# Patient Record
Sex: Male | Born: 1965
Health system: Southern US, Community
[De-identification: ages and names within clinical notes are randomized; demographics above are authoritative.]

## PROBLEM LIST (undated history)

## (undated) DIAGNOSIS — F32A Depression, unspecified: Secondary | ICD-10-CM

## (undated) DIAGNOSIS — Z8719 Personal history of other diseases of the digestive system: Secondary | ICD-10-CM

## (undated) DIAGNOSIS — T7840XA Allergy, unspecified, initial encounter: Secondary | ICD-10-CM

## (undated) DIAGNOSIS — I10 Essential (primary) hypertension: Secondary | ICD-10-CM

## (undated) DIAGNOSIS — F329 Major depressive disorder, single episode, unspecified: Secondary | ICD-10-CM

## (undated) DIAGNOSIS — F419 Anxiety disorder, unspecified: Secondary | ICD-10-CM

## (undated) HISTORY — DX: Allergy, unspecified, initial encounter: T78.40XA

## (undated) HISTORY — DX: Depression, unspecified: F32.A

## (undated) HISTORY — DX: Major depressive disorder, single episode, unspecified: F32.9

## (undated) HISTORY — DX: Personal history of other diseases of the digestive system: Z87.19

## (undated) HISTORY — PX: WISDOM TOOTH EXTRACTION: SHX21

## (undated) HISTORY — PX: VASECTOMY: SHX75

## (undated) HISTORY — DX: Essential (primary) hypertension: I10

## (undated) HISTORY — DX: Anxiety disorder, unspecified: F41.9

## (undated) HISTORY — PX: MOUTH SURGERY: SHX715

---

## 2002-06-03 ENCOUNTER — Encounter: Admission: RE | Admit: 2002-06-03 | Discharge: 2002-06-03 | Payer: Self-pay | Admitting: Internal Medicine

## 2009-06-29 ENCOUNTER — Encounter: Payer: Self-pay | Admitting: Infectious Diseases

## 2009-06-29 ENCOUNTER — Ambulatory Visit: Payer: Self-pay | Admitting: Infectious Diseases

## 2009-06-30 ENCOUNTER — Encounter: Payer: Self-pay | Admitting: Infectious Diseases

## 2009-06-30 ENCOUNTER — Ambulatory Visit: Payer: Self-pay | Admitting: Infectious Diseases

## 2009-10-29 ENCOUNTER — Emergency Department (HOSPITAL_COMMUNITY): Admission: EM | Admit: 2009-10-29 | Discharge: 2009-10-29 | Payer: Self-pay | Admitting: Family Medicine

## 2012-01-09 ENCOUNTER — Ambulatory Visit (INDEPENDENT_AMBULATORY_CARE_PROVIDER_SITE_OTHER): Payer: 59 | Admitting: Sports Medicine

## 2012-01-09 VITALS — BP 100/60 | Ht 69.5 in | Wt 148.0 lb

## 2012-01-09 DIAGNOSIS — M25562 Pain in left knee: Secondary | ICD-10-CM | POA: Insufficient documentation

## 2012-01-09 DIAGNOSIS — M25569 Pain in unspecified knee: Secondary | ICD-10-CM

## 2012-01-09 MED ORDER — MELOXICAM 15 MG PO TABS: 15.0000 mg | ORAL_TABLET | Freq: Every day | ORAL | Status: AC

## 2012-01-09 MED ORDER — KETOPROFEN POWD: Status: DC

## 2012-01-09 NOTE — Patient Instructions (Signed)
Try compression sleeve for activity   Continue biking 2-3 days per week  Try ketoprofen gel over the sore area on your knee  Follow up as needed  Thank you for seeing [place] today!

## 2012-01-09 NOTE — Assessment & Plan Note (Signed)
We will try some topical ketoprofen gel for the lateral kneecap  If this is not helping he continues Mobic 15 mg daily when this flares  I would like him to try compression sleeve to prevent swelling and perhaps change the tracking to where he doesn't have much irritation  If this causes him much pain we could try direct injection into the area of scar tissue

## 2012-01-09 NOTE — Progress Notes (Signed)
Pt is here today with some L knee pain that has been ongoing x 2 wks now. No swelling present but the pain seems to be more suprapatella and going down into the patella tendon area. Pt cycles and runs; cycles more in the warmer months and runs more during the winter/fall months. Pain usually starts around mile 5 during running. No pain when sitting for long periods.   Hx of knee pain- sharp pain behind knee cap.  Worse in late 90s when he was doing marathons. He has most pain with long runs or when carrying a backpack and walking down hills. He does not get pain sitting in a theater.  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    NAD  Lt knee exam: Crepitation with moving patella  Full knee flexion and extension Gets clicking at upper outer corner with flexion and extension Ligaments stable Mcmurray's neg Excellent hip abduction strength on lt Hip flexion and sartorius strength excellent on lt Excellent quad strength  Neutral arch Excellent great toe motion on lt 5th MTP hypertrophy on lt  MSK ultrasound The patellar and quad tendons are normal Vastus lateralis tendon just as it attaches to the patella there is a triangular area of scar tissue that appears calcified There is some hypoechoic change below this and a small suprapatellar pouch effusion Superior patellar groove was visualized and it really looks intact without significant arthritic change

## 2013-09-09 ENCOUNTER — Other Ambulatory Visit: Payer: Self-pay | Admitting: Internal Medicine

## 2013-09-09 DIAGNOSIS — R0989 Other specified symptoms and signs involving the circulatory and respiratory systems: Secondary | ICD-10-CM

## 2013-09-13 ENCOUNTER — Ambulatory Visit
Admission: RE | Admit: 2013-09-13 | Discharge: 2013-09-13 | Disposition: A | Discharge status: Home / Self Care | Payer: 59 | Source: Ambulatory Visit | Attending: Internal Medicine | Admitting: Internal Medicine

## 2013-09-13 DIAGNOSIS — R0989 Other specified symptoms and signs involving the circulatory and respiratory systems: Secondary | ICD-10-CM

## 2015-05-17 ENCOUNTER — Other Ambulatory Visit: Payer: Self-pay | Admitting: *Deleted

## 2015-05-17 MED ORDER — MELOXICAM 15 MG PO TABS: 15.0000 mg | ORAL_TABLET | Freq: Every day | ORAL | Status: DC

## 2015-07-18 ENCOUNTER — Ambulatory Visit (INDEPENDENT_AMBULATORY_CARE_PROVIDER_SITE_OTHER): Payer: 59 | Admitting: Sports Medicine

## 2015-07-18 VITALS — BP 106/67 | Ht 69.0 in | Wt 143.0 lb

## 2015-07-18 DIAGNOSIS — S86812A Strain of other muscle(s) and tendon(s) at lower leg level, left leg, initial encounter: Secondary | ICD-10-CM

## 2015-07-18 DIAGNOSIS — S86119A Strain of other muscle(s) and tendon(s) of posterior muscle group at lower leg level, unspecified leg, initial encounter: Secondary | ICD-10-CM | POA: Insufficient documentation

## 2015-07-18 DIAGNOSIS — S86112A Strain of other muscle(s) and tendon(s) of posterior muscle group at lower leg level, left leg, initial encounter: Secondary | ICD-10-CM

## 2015-07-18 NOTE — Assessment & Plan Note (Signed)
HEP with eccentric calf - note he needs to do this long term to prevent recurrence  Compression  Heel lifts and sports insoles  Icing  OK to do norm activities that do not worsen sxs  NTG protocol  Reck if not better in 6 wks

## 2015-07-18 NOTE — Progress Notes (Signed)
Patient ID: Randy Meyer, male   DOB: 07-31-1966, 49 y.o.   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

## 2015-08-26 DIAGNOSIS — J309 Allergic rhinitis, unspecified: Secondary | ICD-10-CM

## 2015-08-26 DIAGNOSIS — L501 Idiopathic urticaria: Secondary | ICD-10-CM | POA: Insufficient documentation

## 2015-08-26 DIAGNOSIS — H1010 Acute atopic conjunctivitis, unspecified eye: Secondary | ICD-10-CM | POA: Insufficient documentation

## 2015-09-01 ENCOUNTER — Other Ambulatory Visit: Payer: Self-pay | Admitting: *Deleted

## 2015-09-01 MED ORDER — OMALIZUMAB 150 MG ~~LOC~~ SOLR: 300.0000 mg | SUBCUTANEOUS | Status: DC

## 2015-10-06 ENCOUNTER — Ambulatory Visit (INDEPENDENT_AMBULATORY_CARE_PROVIDER_SITE_OTHER): Payer: 59 | Admitting: Neurology

## 2015-10-06 DIAGNOSIS — J309 Allergic rhinitis, unspecified: Secondary | ICD-10-CM

## 2015-10-11 ENCOUNTER — Ambulatory Visit (INDEPENDENT_AMBULATORY_CARE_PROVIDER_SITE_OTHER): Payer: 59 | Admitting: Neurology

## 2015-10-11 DIAGNOSIS — J309 Allergic rhinitis, unspecified: Secondary | ICD-10-CM | POA: Diagnosis not present

## 2015-10-18 ENCOUNTER — Ambulatory Visit (INDEPENDENT_AMBULATORY_CARE_PROVIDER_SITE_OTHER): Payer: 59 | Admitting: Neurology

## 2015-10-18 DIAGNOSIS — J309 Allergic rhinitis, unspecified: Secondary | ICD-10-CM

## 2015-10-23 ENCOUNTER — Other Ambulatory Visit: Payer: Self-pay | Admitting: Internal Medicine

## 2015-10-23 ENCOUNTER — Ambulatory Visit: Payer: 59

## 2015-10-23 DIAGNOSIS — M545 Low back pain: Secondary | ICD-10-CM

## 2015-10-24 ENCOUNTER — Ambulatory Visit
Admission: RE | Admit: 2015-10-24 | Discharge: 2015-10-24 | Disposition: A | Discharge status: Home / Self Care | Payer: 59 | Source: Ambulatory Visit | Attending: Internal Medicine | Admitting: Internal Medicine

## 2015-10-24 DIAGNOSIS — M545 Low back pain: Secondary | ICD-10-CM

## 2015-10-26 ENCOUNTER — Ambulatory Visit (INDEPENDENT_AMBULATORY_CARE_PROVIDER_SITE_OTHER): Payer: 59

## 2015-10-26 DIAGNOSIS — J309 Allergic rhinitis, unspecified: Secondary | ICD-10-CM

## 2015-11-02 ENCOUNTER — Ambulatory Visit (INDEPENDENT_AMBULATORY_CARE_PROVIDER_SITE_OTHER): Payer: 59

## 2015-11-02 DIAGNOSIS — J309 Allergic rhinitis, unspecified: Secondary | ICD-10-CM | POA: Diagnosis not present

## 2015-11-02 DIAGNOSIS — J3081 Allergic rhinitis due to animal (cat) (dog) hair and dander: Secondary | ICD-10-CM | POA: Diagnosis not present

## 2015-11-03 DIAGNOSIS — J3089 Other allergic rhinitis: Secondary | ICD-10-CM | POA: Diagnosis not present

## 2015-11-10 ENCOUNTER — Ambulatory Visit (INDEPENDENT_AMBULATORY_CARE_PROVIDER_SITE_OTHER): Payer: 59

## 2015-11-10 DIAGNOSIS — J309 Allergic rhinitis, unspecified: Secondary | ICD-10-CM

## 2015-11-24 ENCOUNTER — Ambulatory Visit (INDEPENDENT_AMBULATORY_CARE_PROVIDER_SITE_OTHER): Payer: 59 | Admitting: Neurology

## 2015-11-24 DIAGNOSIS — J309 Allergic rhinitis, unspecified: Secondary | ICD-10-CM | POA: Diagnosis not present

## 2015-12-07 ENCOUNTER — Ambulatory Visit (INDEPENDENT_AMBULATORY_CARE_PROVIDER_SITE_OTHER): Payer: 59

## 2015-12-07 DIAGNOSIS — J309 Allergic rhinitis, unspecified: Secondary | ICD-10-CM | POA: Diagnosis not present

## 2015-12-14 ENCOUNTER — Ambulatory Visit (INDEPENDENT_AMBULATORY_CARE_PROVIDER_SITE_OTHER): Payer: 59

## 2015-12-14 DIAGNOSIS — J309 Allergic rhinitis, unspecified: Secondary | ICD-10-CM

## 2015-12-24 HISTORY — PX: COLONOSCOPY: SHX174

## 2015-12-28 DIAGNOSIS — Z0000 Encounter for general adult medical examination without abnormal findings: Secondary | ICD-10-CM | POA: Diagnosis not present

## 2016-01-02 DIAGNOSIS — F419 Anxiety disorder, unspecified: Secondary | ICD-10-CM | POA: Diagnosis not present

## 2016-01-05 ENCOUNTER — Ambulatory Visit (INDEPENDENT_AMBULATORY_CARE_PROVIDER_SITE_OTHER): Payer: 59 | Admitting: *Deleted

## 2016-01-05 DIAGNOSIS — J309 Allergic rhinitis, unspecified: Secondary | ICD-10-CM

## 2016-01-08 MED FILL — valACYclovir HCL 1 GM TABS: 1 | 14 days supply | Qty: 28 | Fill #0

## 2016-01-10 ENCOUNTER — Ambulatory Visit (INDEPENDENT_AMBULATORY_CARE_PROVIDER_SITE_OTHER): Payer: 59 | Admitting: Neurology

## 2016-01-10 DIAGNOSIS — J309 Allergic rhinitis, unspecified: Secondary | ICD-10-CM | POA: Diagnosis not present

## 2016-01-19 ENCOUNTER — Ambulatory Visit (INDEPENDENT_AMBULATORY_CARE_PROVIDER_SITE_OTHER): Payer: 59 | Admitting: Neurology

## 2016-01-19 DIAGNOSIS — J309 Allergic rhinitis, unspecified: Secondary | ICD-10-CM

## 2016-01-30 ENCOUNTER — Ambulatory Visit (INDEPENDENT_AMBULATORY_CARE_PROVIDER_SITE_OTHER): Payer: 59 | Admitting: *Deleted

## 2016-01-30 DIAGNOSIS — J309 Allergic rhinitis, unspecified: Secondary | ICD-10-CM

## 2016-02-06 ENCOUNTER — Ambulatory Visit (INDEPENDENT_AMBULATORY_CARE_PROVIDER_SITE_OTHER): Payer: 59

## 2016-02-06 DIAGNOSIS — J309 Allergic rhinitis, unspecified: Secondary | ICD-10-CM

## 2016-02-12 MED FILL — SERTRALINE HCL 25 MG TABLET: 25 | 90 days supply | Qty: 90 | Fill #0

## 2016-02-15 ENCOUNTER — Ambulatory Visit (INDEPENDENT_AMBULATORY_CARE_PROVIDER_SITE_OTHER): Payer: 59

## 2016-02-15 DIAGNOSIS — J309 Allergic rhinitis, unspecified: Secondary | ICD-10-CM

## 2016-02-20 DIAGNOSIS — F419 Anxiety disorder, unspecified: Secondary | ICD-10-CM | POA: Diagnosis not present

## 2016-02-22 MED FILL — LEVOCETIRIZINE 5 MG TABLET: 5 | 90 days supply | Qty: 180 | Fill #1

## 2016-03-01 ENCOUNTER — Ambulatory Visit (INDEPENDENT_AMBULATORY_CARE_PROVIDER_SITE_OTHER): Payer: 59 | Admitting: *Deleted

## 2016-03-01 DIAGNOSIS — J309 Allergic rhinitis, unspecified: Secondary | ICD-10-CM

## 2016-03-12 ENCOUNTER — Ambulatory Visit (INDEPENDENT_AMBULATORY_CARE_PROVIDER_SITE_OTHER): Payer: 59

## 2016-03-12 DIAGNOSIS — J309 Allergic rhinitis, unspecified: Secondary | ICD-10-CM | POA: Diagnosis not present

## 2016-03-26 DIAGNOSIS — F419 Anxiety disorder, unspecified: Secondary | ICD-10-CM | POA: Diagnosis not present

## 2016-03-27 ENCOUNTER — Ambulatory Visit (INDEPENDENT_AMBULATORY_CARE_PROVIDER_SITE_OTHER): Payer: 59

## 2016-03-27 DIAGNOSIS — J309 Allergic rhinitis, unspecified: Secondary | ICD-10-CM | POA: Diagnosis not present

## 2016-04-02 DIAGNOSIS — J3081 Allergic rhinitis due to animal (cat) (dog) hair and dander: Secondary | ICD-10-CM | POA: Diagnosis not present

## 2016-04-03 DIAGNOSIS — J3089 Other allergic rhinitis: Secondary | ICD-10-CM | POA: Diagnosis not present

## 2016-04-09 ENCOUNTER — Ambulatory Visit (INDEPENDENT_AMBULATORY_CARE_PROVIDER_SITE_OTHER): Payer: 59

## 2016-04-09 DIAGNOSIS — J309 Allergic rhinitis, unspecified: Secondary | ICD-10-CM

## 2016-04-23 ENCOUNTER — Ambulatory Visit (INDEPENDENT_AMBULATORY_CARE_PROVIDER_SITE_OTHER): Payer: 59

## 2016-04-23 DIAGNOSIS — J309 Allergic rhinitis, unspecified: Secondary | ICD-10-CM

## 2016-05-02 MED FILL — SERTRALINE HCL 25 MG TABLET: 25 | 90 days supply | Qty: 90 | Fill #1

## 2016-05-07 ENCOUNTER — Ambulatory Visit (INDEPENDENT_AMBULATORY_CARE_PROVIDER_SITE_OTHER): Payer: 59

## 2016-05-07 DIAGNOSIS — J309 Allergic rhinitis, unspecified: Secondary | ICD-10-CM

## 2016-05-14 DIAGNOSIS — F419 Anxiety disorder, unspecified: Secondary | ICD-10-CM | POA: Diagnosis not present

## 2016-05-21 ENCOUNTER — Ambulatory Visit (INDEPENDENT_AMBULATORY_CARE_PROVIDER_SITE_OTHER): Payer: 59 | Admitting: *Deleted

## 2016-05-21 DIAGNOSIS — J309 Allergic rhinitis, unspecified: Secondary | ICD-10-CM | POA: Diagnosis not present

## 2016-05-31 ENCOUNTER — Ambulatory Visit (INDEPENDENT_AMBULATORY_CARE_PROVIDER_SITE_OTHER): Payer: 59 | Admitting: *Deleted

## 2016-05-31 DIAGNOSIS — J309 Allergic rhinitis, unspecified: Secondary | ICD-10-CM | POA: Diagnosis not present

## 2016-06-06 ENCOUNTER — Ambulatory Visit (INDEPENDENT_AMBULATORY_CARE_PROVIDER_SITE_OTHER): Payer: 59

## 2016-06-06 DIAGNOSIS — J309 Allergic rhinitis, unspecified: Secondary | ICD-10-CM | POA: Diagnosis not present

## 2016-07-02 ENCOUNTER — Ambulatory Visit (INDEPENDENT_AMBULATORY_CARE_PROVIDER_SITE_OTHER): Payer: 59 | Admitting: *Deleted

## 2016-07-02 DIAGNOSIS — J309 Allergic rhinitis, unspecified: Secondary | ICD-10-CM | POA: Diagnosis not present

## 2016-07-09 ENCOUNTER — Ambulatory Visit (INDEPENDENT_AMBULATORY_CARE_PROVIDER_SITE_OTHER): Payer: 59 | Admitting: *Deleted

## 2016-07-09 DIAGNOSIS — J309 Allergic rhinitis, unspecified: Secondary | ICD-10-CM | POA: Diagnosis not present

## 2016-07-16 ENCOUNTER — Ambulatory Visit (INDEPENDENT_AMBULATORY_CARE_PROVIDER_SITE_OTHER): Payer: 59 | Admitting: *Deleted

## 2016-07-16 DIAGNOSIS — J309 Allergic rhinitis, unspecified: Secondary | ICD-10-CM

## 2016-07-16 DIAGNOSIS — F419 Anxiety disorder, unspecified: Secondary | ICD-10-CM | POA: Diagnosis not present

## 2016-07-16 MED FILL — LEVOCETIRIZINE 5 MG TABLET: 5 | 90 days supply | Qty: 180 | Fill #2

## 2016-07-30 ENCOUNTER — Ambulatory Visit (INDEPENDENT_AMBULATORY_CARE_PROVIDER_SITE_OTHER): Payer: 59

## 2016-07-30 DIAGNOSIS — J309 Allergic rhinitis, unspecified: Secondary | ICD-10-CM

## 2016-08-06 DIAGNOSIS — J3081 Allergic rhinitis due to animal (cat) (dog) hair and dander: Secondary | ICD-10-CM | POA: Diagnosis not present

## 2016-08-07 DIAGNOSIS — J3089 Other allergic rhinitis: Secondary | ICD-10-CM | POA: Diagnosis not present

## 2016-08-12 MED FILL — SERTRALINE HCL 25 MG TABLET: 25 | 90 days supply | Qty: 90 | Fill #2

## 2016-08-13 ENCOUNTER — Ambulatory Visit (INDEPENDENT_AMBULATORY_CARE_PROVIDER_SITE_OTHER): Payer: 59

## 2016-08-13 DIAGNOSIS — J309 Allergic rhinitis, unspecified: Secondary | ICD-10-CM | POA: Diagnosis not present

## 2016-08-19 ENCOUNTER — Ambulatory Visit (INDEPENDENT_AMBULATORY_CARE_PROVIDER_SITE_OTHER): Payer: 59

## 2016-08-19 DIAGNOSIS — J309 Allergic rhinitis, unspecified: Secondary | ICD-10-CM

## 2016-08-30 ENCOUNTER — Ambulatory Visit (INDEPENDENT_AMBULATORY_CARE_PROVIDER_SITE_OTHER): Payer: 59 | Admitting: *Deleted

## 2016-08-30 DIAGNOSIS — Z9189 Other specified personal risk factors, not elsewhere classified: Secondary | ICD-10-CM | POA: Diagnosis not present

## 2016-08-30 DIAGNOSIS — IMO0002 Reserved for concepts with insufficient information to code with codable children: Secondary | ICD-10-CM

## 2016-08-30 DIAGNOSIS — Z0000 Encounter for general adult medical examination without abnormal findings: Secondary | ICD-10-CM

## 2016-08-30 DIAGNOSIS — Z23 Encounter for immunization: Secondary | ICD-10-CM | POA: Diagnosis not present

## 2016-08-30 DIAGNOSIS — Z789 Other specified health status: Secondary | ICD-10-CM | POA: Diagnosis not present

## 2016-08-30 DIAGNOSIS — Z7189 Other specified counseling: Secondary | ICD-10-CM | POA: Diagnosis not present

## 2016-08-30 DIAGNOSIS — A09 Infectious gastroenteritis and colitis, unspecified: Secondary | ICD-10-CM

## 2016-08-30 MED ORDER — TYPHOID VACCINE PO CPDR: 1.0000 | DELAYED_RELEASE_CAPSULE | ORAL | 0 refills | Status: DC

## 2016-08-30 MED ORDER — ATOVAQUONE-PROGUANIL HCL 250-100 MG PO TABS: 1.0000 | ORAL_TABLET | Freq: Every day | ORAL | 0 refills | Status: DC

## 2016-08-30 MED ORDER — AZITHROMYCIN 500 MG PO TABS: ORAL_TABLET | ORAL | 0 refills | Status: DC

## 2016-08-30 MED FILL — AZITHROMYCIN 500 MG TABLET: 500 | 5 days supply | Qty: 5 | Fill #0

## 2016-08-30 MED FILL — ATOVAQUONE-PROGUANIL 250-10: 250-100 | 7 days supply | Qty: 20 | Fill #0

## 2016-08-30 MED FILL — VIVOTIF EC CAPSULE: 8 days supply | Qty: 4 | Fill #0

## 2016-09-03 DIAGNOSIS — F419 Anxiety disorder, unspecified: Secondary | ICD-10-CM | POA: Diagnosis not present

## 2016-09-05 ENCOUNTER — Ambulatory Visit (INDEPENDENT_AMBULATORY_CARE_PROVIDER_SITE_OTHER): Payer: 59 | Admitting: *Deleted

## 2016-09-05 DIAGNOSIS — J309 Allergic rhinitis, unspecified: Secondary | ICD-10-CM

## 2016-09-17 DIAGNOSIS — F419 Anxiety disorder, unspecified: Secondary | ICD-10-CM | POA: Diagnosis not present

## 2016-09-24 ENCOUNTER — Ambulatory Visit (INDEPENDENT_AMBULATORY_CARE_PROVIDER_SITE_OTHER): Payer: 59

## 2016-09-24 DIAGNOSIS — H524 Presbyopia: Secondary | ICD-10-CM | POA: Diagnosis not present

## 2016-09-24 DIAGNOSIS — J309 Allergic rhinitis, unspecified: Secondary | ICD-10-CM | POA: Diagnosis not present

## 2016-10-01 ENCOUNTER — Ambulatory Visit (INDEPENDENT_AMBULATORY_CARE_PROVIDER_SITE_OTHER): Payer: 59 | Admitting: *Deleted

## 2016-10-01 DIAGNOSIS — J309 Allergic rhinitis, unspecified: Secondary | ICD-10-CM

## 2016-10-04 ENCOUNTER — Ambulatory Visit: Payer: 59 | Admitting: *Deleted

## 2016-10-04 VITALS — Ht 69.0 in | Wt 153.2 lb

## 2016-10-04 DIAGNOSIS — Z1211 Encounter for screening for malignant neoplasm of colon: Secondary | ICD-10-CM

## 2016-10-04 NOTE — Progress Notes (Signed)
Patient denies any allergies to egg or soy products. Patient denies complications with anesthesia/sedation.  Patient denies oxygen use at home and denies diet medications. Emmi instructions for colonoscopy explained but patient denied.     

## 2016-10-10 ENCOUNTER — Ambulatory Visit (INDEPENDENT_AMBULATORY_CARE_PROVIDER_SITE_OTHER): Payer: 59 | Admitting: *Deleted

## 2016-10-10 DIAGNOSIS — J309 Allergic rhinitis, unspecified: Secondary | ICD-10-CM | POA: Diagnosis not present

## 2016-10-10 DIAGNOSIS — F419 Anxiety disorder, unspecified: Secondary | ICD-10-CM | POA: Diagnosis not present

## 2016-10-18 ENCOUNTER — Ambulatory Visit (INDEPENDENT_AMBULATORY_CARE_PROVIDER_SITE_OTHER): Payer: 59

## 2016-10-18 DIAGNOSIS — J309 Allergic rhinitis, unspecified: Secondary | ICD-10-CM

## 2016-10-22 ENCOUNTER — Encounter: Payer: Self-pay | Admitting: Internal Medicine

## 2016-10-22 ENCOUNTER — Ambulatory Visit (AMBULATORY_SURGERY_CENTER): Payer: 59 | Admitting: Internal Medicine

## 2016-10-22 VITALS — BP 112/58 | HR 57 | Temp 97.1°F | Resp 10 | Ht 69.0 in | Wt 153.0 lb

## 2016-10-22 DIAGNOSIS — Z1212 Encounter for screening for malignant neoplasm of rectum: Secondary | ICD-10-CM | POA: Diagnosis not present

## 2016-10-22 DIAGNOSIS — Z1211 Encounter for screening for malignant neoplasm of colon: Secondary | ICD-10-CM

## 2016-10-22 MED ORDER — SODIUM CHLORIDE 0.9 % IV SOLN: 500.0000 mL | INTRAVENOUS | Status: DC

## 2016-10-22 NOTE — Patient Instructions (Addendum)
   The colonoscopy looked normal. I did not see any fissure(s).  Next routine colonoscopy/screening test in 10 years - 2027  I appreciate the opportunity to care for you. Gatha Mayer, MD, FACG   YOU HAD AN ENDOSCOPIC PROCEDURE TODAY AT [facility]:   Refer to the procedure report that was given to you for any specific questions about what was found during the examination.  If the procedure report does not answer your questions, please call your gastroenterologist to clarify.  If you requested that your care partner not be given the details of your procedure findings, then the procedure report has been included in a sealed envelope for you to review at your convenience later.  YOU SHOULD EXPECT: Some feelings of bloating in the abdomen. Passage of more gas than usual.  Walking can help get rid of the air that was put into your GI tract during the procedure and reduce the bloating. If you had a lower endoscopy (such as a colonoscopy or flexible sigmoidoscopy) you may notice spotting of blood in your stool or on the toilet paper. If you underwent a bowel prep for your procedure, you may not have a normal bowel movement for a few days.  Please Note:  You might notice some irritation and congestion in your nose or some drainage.  This is from the oxygen used during your procedure.  There is no need for concern and it should clear up in a day or so.  SYMPTOMS TO REPORT IMMEDIATELY:   Following lower endoscopy (colonoscopy or flexible sigmoidoscopy):  Excessive amounts of blood in the stool  Significant tenderness or worsening of abdominal pains  Swelling of the abdomen that is new, acute  Fever of 100F or higher  For urgent or emergent issues, a gastroenterologist can be reached at any hour by calling [phone].  DIET:  We do recommend a small meal at first, but then you may proceed to your regular diet.  Drink plenty of fluids but you should avoid alcoholic  beverages for 24 hours.  ACTIVITY:  You should plan to take it easy for the rest of today and you should NOT DRIVE or use heavy machinery until tomorrow (because of the sedation medicines used during the test).    FOLLOW UP: Our staff will call the number listed on your records the next business day following your procedure to check on you and address any questions or concerns that you may have regarding the information given to you following your procedure. If we do not reach you, we will leave a message.  However, if you are feeling well and you are not experiencing any problems, there is no need to return our call.  We will assume that you have returned to your regular daily activities without incident.  SIGNATURES/CONFIDENTIALITY: You and/or your care partner have signed paperwork which will be entered into your electronic medical record.  These signatures attest to the fact that that the information above on your After Visit Summary has been reviewed and is understood.  Full responsibility of the confidentiality of this discharge information lies with you and/or your care-partner.  Please continue your normal medications

## 2016-10-22 NOTE — Op Note (Signed)
[facility] Patient Name: Randy Meyer Procedure Date: 10/22/2016 11:29 AM MRN: [id number] Endoscopist: Gatha Mayer , MD Age: 50 Referring MD:  Date of Birth: 1966-09-01 Gender: Male Account #: [id number] Procedure:                Colonoscopy Indications:              Screening for colorectal malignant neoplasm, This                            is the patient's first colonoscopy Medicines:                Propofol per Anesthesia, Monitored Anesthesia Care Procedure:                Pre-Anesthesia Assessment:                           - Prior to the procedure, a History and Physical                            was performed, and patient medications and                            allergies were reviewed. The patient's tolerance of                            previous anesthesia was also reviewed. The risks                            and benefits of the procedure and the sedation                            options and risks were discussed with the patient.                            All questions were answered, and informed consent                            was obtained. Prior Anticoagulants: The patient has                            taken no previous anticoagulant or antiplatelet                            agents. ASA Grade Assessment: II - A patient with                            mild systemic disease. After reviewing the risks                            and benefits, the patient was deemed in                            satisfactory condition to undergo the procedure.  After obtaining informed consent, the colonoscope                            was passed under direct vision. Throughout the                            procedure, the patient's blood pressure, pulse, and                            oxygen saturations were monitored continuously. The                            Model CF-HQ190L [id number]) scope was introduced                             through the anus and advanced to the the cecum,                            identified by appendiceal orifice and ileocecal                            valve. The colonoscopy was performed without                            difficulty. The patient tolerated the procedure                            well. The quality of the bowel preparation was                            good. The bowel preparation used was Miralax. The                            ileocecal valve, appendiceal orifice, and rectum                            were photographed. Scope In: 11:37:22 AM Scope Out: 11:50:25 AM Scope Withdrawal Time: 0 hours 10 minutes 26 seconds  Total Procedure Duration: 0 hours 13 minutes 3 seconds  Findings:                 The perianal and digital rectal examinations were                            normal. Pertinent negatives include normal prostate                            (size, shape, and consistency).                           The entire examined colon appeared normal on direct                            and retroflexion views. Complications:  No immediate complications. Estimated Blood Loss:     Estimated blood loss: none. Impression:               - The entire examined colon is normal on direct and                            retroflexion views.                           - No specimens collected. Recommendation:           - Patient has a contact number available for                            emergencies. The signs and symptoms of potential                            delayed complications were discussed with the                            patient. Return to normal activities tomorrow.                            Written discharge instructions were provided to the                            patient.                           - Resume previous diet.                           - Continue present medications.                           - Repeat colonoscopy/other screening test in 10                             years. Gatha Mayer, MD 10/22/2016 11:56:03 AM This report has been signed electronically.

## 2016-10-23 ENCOUNTER — Telehealth: Payer: Self-pay

## 2016-10-23 NOTE — Telephone Encounter (Signed)
Left message on answering machine. 

## 2016-10-25 ENCOUNTER — Ambulatory Visit (INDEPENDENT_AMBULATORY_CARE_PROVIDER_SITE_OTHER): Payer: 59

## 2016-10-25 DIAGNOSIS — J309 Allergic rhinitis, unspecified: Secondary | ICD-10-CM | POA: Diagnosis not present

## 2016-10-29 DIAGNOSIS — F419 Anxiety disorder, unspecified: Secondary | ICD-10-CM | POA: Diagnosis not present

## 2016-11-06 ENCOUNTER — Ambulatory Visit (INDEPENDENT_AMBULATORY_CARE_PROVIDER_SITE_OTHER): Payer: 59 | Admitting: *Deleted

## 2016-11-06 DIAGNOSIS — J309 Allergic rhinitis, unspecified: Secondary | ICD-10-CM

## 2016-11-07 MED FILL — SERTRALINE HCL 25 MG TABLET: 25 | 90 days supply | Qty: 90 | Fill #3

## 2016-11-19 DIAGNOSIS — F419 Anxiety disorder, unspecified: Secondary | ICD-10-CM | POA: Diagnosis not present

## 2016-11-26 ENCOUNTER — Ambulatory Visit (INDEPENDENT_AMBULATORY_CARE_PROVIDER_SITE_OTHER): Payer: 59 | Admitting: *Deleted

## 2016-11-26 DIAGNOSIS — J309 Allergic rhinitis, unspecified: Secondary | ICD-10-CM | POA: Diagnosis not present

## 2016-11-28 ENCOUNTER — Other Ambulatory Visit: Payer: Self-pay | Admitting: Allergy and Immunology

## 2016-11-28 MED FILL — EPINEPHRINE 0.3 MG AUTO-INJ: 0.3 | 30 days supply | Qty: 2 | Fill #0

## 2016-12-02 MED FILL — valACYclovir HCL 1 GM TABS: 1 | 14 days supply | Qty: 28 | Fill #1

## 2016-12-10 DIAGNOSIS — F419 Anxiety disorder, unspecified: Secondary | ICD-10-CM | POA: Diagnosis not present

## 2016-12-20 ENCOUNTER — Ambulatory Visit (INDEPENDENT_AMBULATORY_CARE_PROVIDER_SITE_OTHER): Payer: 59

## 2016-12-20 DIAGNOSIS — J309 Allergic rhinitis, unspecified: Secondary | ICD-10-CM

## 2016-12-24 MED FILL — LEVOCETIRIZINE 5 MG TABLET: 5 | 90 days supply | Qty: 180 | Fill #0

## 2016-12-26 ENCOUNTER — Ambulatory Visit (INDEPENDENT_AMBULATORY_CARE_PROVIDER_SITE_OTHER): Payer: 59

## 2016-12-26 DIAGNOSIS — J309 Allergic rhinitis, unspecified: Secondary | ICD-10-CM | POA: Diagnosis not present

## 2017-01-03 ENCOUNTER — Ambulatory Visit (INDEPENDENT_AMBULATORY_CARE_PROVIDER_SITE_OTHER): Payer: 59

## 2017-01-03 DIAGNOSIS — J309 Allergic rhinitis, unspecified: Secondary | ICD-10-CM | POA: Diagnosis not present

## 2017-01-16 DIAGNOSIS — J3081 Allergic rhinitis due to animal (cat) (dog) hair and dander: Secondary | ICD-10-CM | POA: Diagnosis not present

## 2017-01-17 DIAGNOSIS — J3089 Other allergic rhinitis: Secondary | ICD-10-CM

## 2017-01-21 ENCOUNTER — Ambulatory Visit (INDEPENDENT_AMBULATORY_CARE_PROVIDER_SITE_OTHER): Payer: 59

## 2017-01-21 DIAGNOSIS — J309 Allergic rhinitis, unspecified: Secondary | ICD-10-CM | POA: Diagnosis not present

## 2017-01-23 DIAGNOSIS — F419 Anxiety disorder, unspecified: Secondary | ICD-10-CM | POA: Diagnosis not present

## 2017-01-27 NOTE — Addendum Note (Signed)
Addended by: Felipa Emory on: 01/27/2017 03:37 PM   Modules accepted: Orders

## 2017-01-28 ENCOUNTER — Ambulatory Visit (INDEPENDENT_AMBULATORY_CARE_PROVIDER_SITE_OTHER): Payer: 59

## 2017-01-28 DIAGNOSIS — J309 Allergic rhinitis, unspecified: Secondary | ICD-10-CM | POA: Diagnosis not present

## 2017-02-06 ENCOUNTER — Ambulatory Visit (INDEPENDENT_AMBULATORY_CARE_PROVIDER_SITE_OTHER): Payer: 59 | Admitting: *Deleted

## 2017-02-06 DIAGNOSIS — J309 Allergic rhinitis, unspecified: Secondary | ICD-10-CM

## 2017-02-11 MED FILL — SERTRALINE HCL 25 MG TABLET: 25 | 90 days supply | Qty: 90 | Fill #0

## 2017-02-13 ENCOUNTER — Ambulatory Visit (INDEPENDENT_AMBULATORY_CARE_PROVIDER_SITE_OTHER): Payer: 59 | Admitting: *Deleted

## 2017-02-13 DIAGNOSIS — J309 Allergic rhinitis, unspecified: Secondary | ICD-10-CM

## 2017-02-18 ENCOUNTER — Ambulatory Visit (INDEPENDENT_AMBULATORY_CARE_PROVIDER_SITE_OTHER): Payer: 59 | Admitting: *Deleted

## 2017-02-18 DIAGNOSIS — J309 Allergic rhinitis, unspecified: Secondary | ICD-10-CM

## 2017-02-25 DIAGNOSIS — F419 Anxiety disorder, unspecified: Secondary | ICD-10-CM | POA: Diagnosis not present

## 2017-02-26 ENCOUNTER — Ambulatory Visit (INDEPENDENT_AMBULATORY_CARE_PROVIDER_SITE_OTHER): Payer: 59 | Admitting: *Deleted

## 2017-02-26 DIAGNOSIS — J309 Allergic rhinitis, unspecified: Secondary | ICD-10-CM

## 2017-03-14 ENCOUNTER — Ambulatory Visit (INDEPENDENT_AMBULATORY_CARE_PROVIDER_SITE_OTHER): Payer: 59

## 2017-03-14 DIAGNOSIS — J309 Allergic rhinitis, unspecified: Secondary | ICD-10-CM | POA: Diagnosis not present

## 2017-03-27 ENCOUNTER — Ambulatory Visit (INDEPENDENT_AMBULATORY_CARE_PROVIDER_SITE_OTHER): Payer: 59 | Admitting: *Deleted

## 2017-03-27 DIAGNOSIS — F419 Anxiety disorder, unspecified: Secondary | ICD-10-CM | POA: Diagnosis not present

## 2017-03-27 DIAGNOSIS — J309 Allergic rhinitis, unspecified: Secondary | ICD-10-CM

## 2017-04-08 ENCOUNTER — Ambulatory Visit (INDEPENDENT_AMBULATORY_CARE_PROVIDER_SITE_OTHER): Payer: 59

## 2017-04-08 DIAGNOSIS — J309 Allergic rhinitis, unspecified: Secondary | ICD-10-CM

## 2017-04-16 ENCOUNTER — Encounter: Payer: Self-pay | Admitting: *Deleted

## 2017-04-16 NOTE — Progress Notes (Signed)
Maintenance vial made

## 2017-04-18 DIAGNOSIS — J3089 Other allergic rhinitis: Secondary | ICD-10-CM | POA: Diagnosis not present

## 2017-04-29 ENCOUNTER — Ambulatory Visit (INDEPENDENT_AMBULATORY_CARE_PROVIDER_SITE_OTHER): Payer: 59 | Admitting: *Deleted

## 2017-04-29 DIAGNOSIS — J309 Allergic rhinitis, unspecified: Secondary | ICD-10-CM | POA: Diagnosis not present

## 2017-05-05 MED FILL — SERTRALINE HCL 25 MG TABLET: 25 | 90 days supply | Qty: 90 | Fill #1

## 2017-05-12 ENCOUNTER — Ambulatory Visit (INDEPENDENT_AMBULATORY_CARE_PROVIDER_SITE_OTHER): Payer: 59 | Admitting: *Deleted

## 2017-05-12 DIAGNOSIS — J309 Allergic rhinitis, unspecified: Secondary | ICD-10-CM | POA: Diagnosis not present

## 2017-05-20 DIAGNOSIS — F419 Anxiety disorder, unspecified: Secondary | ICD-10-CM | POA: Diagnosis not present

## 2017-05-26 ENCOUNTER — Ambulatory Visit (INDEPENDENT_AMBULATORY_CARE_PROVIDER_SITE_OTHER): Payer: 59

## 2017-05-26 DIAGNOSIS — J309 Allergic rhinitis, unspecified: Secondary | ICD-10-CM | POA: Diagnosis not present

## 2017-05-27 MED FILL — LEVOCETIRIZINE 5 MG TABLET: 5 | 90 days supply | Qty: 180 | Fill #1

## 2017-06-04 ENCOUNTER — Ambulatory Visit (INDEPENDENT_AMBULATORY_CARE_PROVIDER_SITE_OTHER): Payer: 59 | Admitting: *Deleted

## 2017-06-04 DIAGNOSIS — J309 Allergic rhinitis, unspecified: Secondary | ICD-10-CM | POA: Diagnosis not present

## 2017-06-11 ENCOUNTER — Ambulatory Visit (INDEPENDENT_AMBULATORY_CARE_PROVIDER_SITE_OTHER): Payer: 59

## 2017-06-11 DIAGNOSIS — J309 Allergic rhinitis, unspecified: Secondary | ICD-10-CM

## 2017-06-17 ENCOUNTER — Ambulatory Visit (INDEPENDENT_AMBULATORY_CARE_PROVIDER_SITE_OTHER): Payer: 59 | Admitting: *Deleted

## 2017-06-17 DIAGNOSIS — J309 Allergic rhinitis, unspecified: Secondary | ICD-10-CM | POA: Diagnosis not present

## 2017-06-24 ENCOUNTER — Ambulatory Visit (INDEPENDENT_AMBULATORY_CARE_PROVIDER_SITE_OTHER): Payer: 59

## 2017-06-24 DIAGNOSIS — J309 Allergic rhinitis, unspecified: Secondary | ICD-10-CM

## 2017-07-09 ENCOUNTER — Ambulatory Visit (INDEPENDENT_AMBULATORY_CARE_PROVIDER_SITE_OTHER): Payer: 59

## 2017-07-09 DIAGNOSIS — J309 Allergic rhinitis, unspecified: Secondary | ICD-10-CM

## 2017-07-10 DIAGNOSIS — F419 Anxiety disorder, unspecified: Secondary | ICD-10-CM | POA: Diagnosis not present

## 2017-07-16 ENCOUNTER — Ambulatory Visit
Admission: RE | Admit: 2017-07-16 | Discharge: 2017-07-16 | Disposition: A | Discharge status: Home / Self Care | Payer: 59 | Source: Ambulatory Visit | Attending: Family Medicine | Admitting: Family Medicine

## 2017-07-16 ENCOUNTER — Ambulatory Visit (INDEPENDENT_AMBULATORY_CARE_PROVIDER_SITE_OTHER): Payer: 59 | Admitting: Family Medicine

## 2017-07-16 VITALS — BP 124/78 | Ht 70.0 in | Wt 150.0 lb

## 2017-07-16 DIAGNOSIS — M545 Low back pain, unspecified: Secondary | ICD-10-CM | POA: Insufficient documentation

## 2017-07-16 NOTE — Assessment & Plan Note (Signed)
Patient is here with signs and symptoms consistent with quadratus lumborum muscle strain/spasms. Differential includes degenerative disc disease. Patient's symptoms were negative for any red flag concerns. 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 Will treat conservatively at this time. - Two-view lumbar x-ray to assess lumbar spine - PT referral with focus on quadratus lumborum - Ice/heat/NSAIDs as needed/tolerated - Follow-up as needed

## 2017-07-16 NOTE — Patient Instructions (Signed)
It was a pleasure seeing you today in our clinic. Today we discussed your back pain. This injury and the pain involved may take a long time to heal/resolve, so be patient. Here is the treatment plan I would like for you to follow:

## 2017-07-16 NOTE — Progress Notes (Signed)
   HPI  CC: Low back pain Patient is here complaining of low back pain. He denies any injury or trauma. He states that the low back pain is more right-sided and can be exacerbated with prolonged stasis. Back pain is located to the mid and upper lumbar region. It does not radiate. It is occasionally sharp and achy. He denies any lower extremity symptoms, radiation, weakness, numbness, or paresthesias.  Traumatic: No  Location: Mid lumbar mostly on the right  Quality: Occasionally sharp, achy  Duration: Intermittent since January  Timing: Worse with prolonged biking, long car rides, first thing in the morning  Improving/Worsening: Seems to be worsening  Makes better: Ice, heat  Makes worse: Prolonged sitting/biking, twisting  Associated symptoms: None   Previous Interventions Tried: Ice, heat, Motrin, Tylenol, Celebrex   Past Injuries: None  Past Surgeries: None  Smoking: Nonsmoker  Family Hx: Noncontributory   ROS: Per HPI; in addition no fever, no rash, no additional weakness, no additional numbness, no additional paresthesias, and no additional falls/injury.   Objective: BP 124/78   Ht 5\' 10"  (1.778 m)   Wt 150 lb (68 kg)   BMI 21.52 kg/m  Gen: NAD, well groomed, a/o x3, normal affect.  CV: Well-perfused. Warm.  Resp: Non-labored.  Neuro: Sensation intact throughout. No gross coordination deficits.  Gait: Nonpathologic posture, unremarkable stride without signs of limp or balance issues. Back Exam: No evidence of rash, erythema, ecchymosis, or deformity. TTP along the right paraspinal muscles of L2-3. ROM intact with slight exacerbation of pain with twisting to the left. Negative SLR. FABER/FADIR negative. Strength intact throughout. Sensation intact throughout. Trendelenburg negative   Assessment and Plan:  Low back pain Patient is here with signs and symptoms consistent with quadratus lumborum muscle strain/spasms. Differential includes degenerative disc disease.  Patient's symptoms were negative for any red flag concerns. 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 Will treat conservatively at this time. - Two-view lumbar x-ray to assess lumbar spine - PT referral with focus on quadratus lumborum - Ice/heat/NSAIDs as needed/tolerated - Follow-up as needed   Orders Placed This Encounter  Procedures  . DG Lumbar Spine 2-3 Views    Standing Status:   Future    Number of Occurrences:   1    Standing Expiration Date:   09/16/2018    Order Specific Question:   Reason for Exam (SYMPTOM  OR DIAGNOSIS REQUIRED)    Answer:   low back pain; AP and lateral views    Order Specific Question:   Preferred imaging location?    Answer:   [facility]    Order Specific Question:   Radiology Contrast Protocol - do NOT remove file path    Answer:   [email or web address]    Elberta Leatherwood, MD,MS [facility] Fellow 07/16/2017 6:46 PM

## 2017-07-18 ENCOUNTER — Telehealth: Payer: Self-pay | Admitting: Sports Medicine

## 2017-07-18 NOTE — Telephone Encounter (Signed)
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 Overall his spine appears to be very healthy. He does have some loss of the normal lumbar lordosis which is likely due to muscle imbalance and tightness. I recommend that he proceed with physical therapy/[facility] working with Leatrice [facility]els. He will let me know if his symptoms persist or worsen.

## 2017-07-29 ENCOUNTER — Ambulatory Visit (INDEPENDENT_AMBULATORY_CARE_PROVIDER_SITE_OTHER): Payer: 59 | Admitting: *Deleted

## 2017-07-29 DIAGNOSIS — J309 Allergic rhinitis, unspecified: Secondary | ICD-10-CM

## 2017-07-30 MED FILL — ONDANSETRON ODT 8 MG TABLET: 8 | 10 days supply | Qty: 20 | Fill #0

## 2017-08-05 MED FILL — SERTRALINE HCL 25 MG TABLET: 25 | 90 days supply | Qty: 90 | Fill #2

## 2017-08-21 ENCOUNTER — Ambulatory Visit (INDEPENDENT_AMBULATORY_CARE_PROVIDER_SITE_OTHER): Payer: 59 | Admitting: *Deleted

## 2017-08-21 DIAGNOSIS — J309 Allergic rhinitis, unspecified: Secondary | ICD-10-CM

## 2017-08-21 DIAGNOSIS — F419 Anxiety disorder, unspecified: Secondary | ICD-10-CM | POA: Diagnosis not present

## 2017-08-28 NOTE — Progress Notes (Signed)
2 Mt vials made Exp. 08/29/18

## 2017-08-29 DIAGNOSIS — J301 Allergic rhinitis due to pollen: Secondary | ICD-10-CM | POA: Diagnosis not present

## 2017-09-03 ENCOUNTER — Ambulatory Visit (INDEPENDENT_AMBULATORY_CARE_PROVIDER_SITE_OTHER): Payer: 59

## 2017-09-03 DIAGNOSIS — J309 Allergic rhinitis, unspecified: Secondary | ICD-10-CM | POA: Diagnosis not present

## 2017-09-17 ENCOUNTER — Ambulatory Visit (INDEPENDENT_AMBULATORY_CARE_PROVIDER_SITE_OTHER): Payer: 59

## 2017-09-17 DIAGNOSIS — J309 Allergic rhinitis, unspecified: Secondary | ICD-10-CM

## 2017-09-30 ENCOUNTER — Ambulatory Visit (INDEPENDENT_AMBULATORY_CARE_PROVIDER_SITE_OTHER): Payer: 59

## 2017-09-30 DIAGNOSIS — J309 Allergic rhinitis, unspecified: Secondary | ICD-10-CM

## 2017-10-09 DIAGNOSIS — F419 Anxiety disorder, unspecified: Secondary | ICD-10-CM | POA: Diagnosis not present

## 2017-10-13 ENCOUNTER — Ambulatory Visit (INDEPENDENT_AMBULATORY_CARE_PROVIDER_SITE_OTHER): Payer: 59

## 2017-10-13 DIAGNOSIS — J309 Allergic rhinitis, unspecified: Secondary | ICD-10-CM | POA: Diagnosis not present

## 2017-10-21 ENCOUNTER — Ambulatory Visit (INDEPENDENT_AMBULATORY_CARE_PROVIDER_SITE_OTHER): Payer: 59 | Admitting: *Deleted

## 2017-10-21 DIAGNOSIS — J309 Allergic rhinitis, unspecified: Secondary | ICD-10-CM | POA: Diagnosis not present

## 2017-10-30 ENCOUNTER — Ambulatory Visit (INDEPENDENT_AMBULATORY_CARE_PROVIDER_SITE_OTHER): Payer: 59 | Admitting: *Deleted

## 2017-10-30 DIAGNOSIS — J309 Allergic rhinitis, unspecified: Secondary | ICD-10-CM

## 2017-10-31 MED FILL — SERTRALINE HCL 25 MG TABLET: 25 | 90 days supply | Qty: 90 | Fill #3

## 2017-11-04 ENCOUNTER — Ambulatory Visit (INDEPENDENT_AMBULATORY_CARE_PROVIDER_SITE_OTHER): Payer: 59 | Admitting: *Deleted

## 2017-11-04 DIAGNOSIS — J309 Allergic rhinitis, unspecified: Secondary | ICD-10-CM

## 2017-11-06 DIAGNOSIS — F419 Anxiety disorder, unspecified: Secondary | ICD-10-CM | POA: Diagnosis not present

## 2017-11-18 ENCOUNTER — Ambulatory Visit (INDEPENDENT_AMBULATORY_CARE_PROVIDER_SITE_OTHER): Payer: 59 | Admitting: *Deleted

## 2017-11-18 DIAGNOSIS — J309 Allergic rhinitis, unspecified: Secondary | ICD-10-CM | POA: Diagnosis not present

## 2017-11-24 ENCOUNTER — Ambulatory Visit: Payer: Self-pay | Admitting: *Deleted

## 2017-12-08 MED FILL — LEVOCETIRIZINE 5 MG TABLET: 5 | 90 days supply | Qty: 180 | Fill #2

## 2017-12-11 ENCOUNTER — Ambulatory Visit (INDEPENDENT_AMBULATORY_CARE_PROVIDER_SITE_OTHER): Payer: 59 | Admitting: *Deleted

## 2017-12-11 DIAGNOSIS — J309 Allergic rhinitis, unspecified: Secondary | ICD-10-CM | POA: Diagnosis not present

## 2017-12-29 MED FILL — valACYclovir HCL 1 GM TABS: 1 | 14 days supply | Qty: 28 | Fill #0

## 2018-01-06 ENCOUNTER — Ambulatory Visit (INDEPENDENT_AMBULATORY_CARE_PROVIDER_SITE_OTHER): Payer: 59 | Admitting: *Deleted

## 2018-01-06 DIAGNOSIS — J309 Allergic rhinitis, unspecified: Secondary | ICD-10-CM | POA: Diagnosis not present

## 2018-01-14 DIAGNOSIS — J301 Allergic rhinitis due to pollen: Secondary | ICD-10-CM | POA: Diagnosis not present

## 2018-01-22 DIAGNOSIS — F419 Anxiety disorder, unspecified: Secondary | ICD-10-CM | POA: Diagnosis not present

## 2018-01-26 MED FILL — SERTRALINE HCL 25 MG TABLET: 25 | 90 days supply | Qty: 90 | Fill #0

## 2018-01-27 ENCOUNTER — Ambulatory Visit (INDEPENDENT_AMBULATORY_CARE_PROVIDER_SITE_OTHER): Payer: 59 | Admitting: *Deleted

## 2018-01-27 DIAGNOSIS — J309 Allergic rhinitis, unspecified: Secondary | ICD-10-CM

## 2018-02-19 DIAGNOSIS — Z0000 Encounter for general adult medical examination without abnormal findings: Secondary | ICD-10-CM | POA: Diagnosis not present

## 2018-02-19 DIAGNOSIS — F419 Anxiety disorder, unspecified: Secondary | ICD-10-CM | POA: Diagnosis not present

## 2018-02-26 DIAGNOSIS — F419 Anxiety disorder, unspecified: Secondary | ICD-10-CM | POA: Diagnosis not present

## 2018-03-10 ENCOUNTER — Ambulatory Visit (INDEPENDENT_AMBULATORY_CARE_PROVIDER_SITE_OTHER): Payer: 59 | Admitting: *Deleted

## 2018-03-10 DIAGNOSIS — J309 Allergic rhinitis, unspecified: Secondary | ICD-10-CM | POA: Diagnosis not present

## 2018-03-16 ENCOUNTER — Ambulatory Visit (INDEPENDENT_AMBULATORY_CARE_PROVIDER_SITE_OTHER): Payer: 59 | Admitting: *Deleted

## 2018-03-16 DIAGNOSIS — J309 Allergic rhinitis, unspecified: Secondary | ICD-10-CM | POA: Diagnosis not present

## 2018-03-26 DIAGNOSIS — F419 Anxiety disorder, unspecified: Secondary | ICD-10-CM | POA: Diagnosis not present

## 2018-04-09 ENCOUNTER — Ambulatory Visit (INDEPENDENT_AMBULATORY_CARE_PROVIDER_SITE_OTHER): Payer: 59 | Admitting: *Deleted

## 2018-04-09 DIAGNOSIS — J309 Allergic rhinitis, unspecified: Secondary | ICD-10-CM

## 2018-04-16 ENCOUNTER — Ambulatory Visit (INDEPENDENT_AMBULATORY_CARE_PROVIDER_SITE_OTHER): Payer: 59 | Admitting: *Deleted

## 2018-04-16 DIAGNOSIS — J309 Allergic rhinitis, unspecified: Secondary | ICD-10-CM | POA: Diagnosis not present

## 2018-04-20 MED FILL — SERTRALINE HCL 25 MG TABLET: 25 | 90 days supply | Qty: 90 | Fill #1

## 2018-04-20 MED FILL — LEVOCETIRIZINE 5 MG TABLET: 5 | 90 days supply | Qty: 180 | Fill #0

## 2018-04-23 ENCOUNTER — Ambulatory Visit (INDEPENDENT_AMBULATORY_CARE_PROVIDER_SITE_OTHER): Payer: 59 | Admitting: *Deleted

## 2018-04-23 DIAGNOSIS — J309 Allergic rhinitis, unspecified: Secondary | ICD-10-CM | POA: Diagnosis not present

## 2018-04-30 ENCOUNTER — Ambulatory Visit (INDEPENDENT_AMBULATORY_CARE_PROVIDER_SITE_OTHER): Payer: 59

## 2018-04-30 DIAGNOSIS — J309 Allergic rhinitis, unspecified: Secondary | ICD-10-CM | POA: Diagnosis not present

## 2018-05-06 ENCOUNTER — Ambulatory Visit (INDEPENDENT_AMBULATORY_CARE_PROVIDER_SITE_OTHER): Payer: 59 | Admitting: *Deleted

## 2018-05-06 DIAGNOSIS — J309 Allergic rhinitis, unspecified: Secondary | ICD-10-CM

## 2018-05-07 DIAGNOSIS — F419 Anxiety disorder, unspecified: Secondary | ICD-10-CM | POA: Diagnosis not present

## 2018-06-11 DIAGNOSIS — F419 Anxiety disorder, unspecified: Secondary | ICD-10-CM | POA: Diagnosis not present

## 2018-06-23 ENCOUNTER — Ambulatory Visit (INDEPENDENT_AMBULATORY_CARE_PROVIDER_SITE_OTHER): Payer: 59 | Admitting: *Deleted

## 2018-06-23 DIAGNOSIS — J309 Allergic rhinitis, unspecified: Secondary | ICD-10-CM

## 2018-06-29 ENCOUNTER — Ambulatory Visit (INDEPENDENT_AMBULATORY_CARE_PROVIDER_SITE_OTHER): Payer: 59 | Admitting: *Deleted

## 2018-06-29 DIAGNOSIS — J309 Allergic rhinitis, unspecified: Secondary | ICD-10-CM | POA: Diagnosis not present

## 2018-07-09 DIAGNOSIS — F419 Anxiety disorder, unspecified: Secondary | ICD-10-CM | POA: Diagnosis not present

## 2018-07-20 MED FILL — SERTRALINE HCL 25 MG TABLET: 25 | 90 days supply | Qty: 90 | Fill #2

## 2018-07-21 ENCOUNTER — Ambulatory Visit (INDEPENDENT_AMBULATORY_CARE_PROVIDER_SITE_OTHER): Payer: 59 | Admitting: *Deleted

## 2018-07-21 DIAGNOSIS — J309 Allergic rhinitis, unspecified: Secondary | ICD-10-CM | POA: Diagnosis not present

## 2018-08-06 ENCOUNTER — Ambulatory Visit (INDEPENDENT_AMBULATORY_CARE_PROVIDER_SITE_OTHER): Payer: 59 | Admitting: *Deleted

## 2018-08-06 DIAGNOSIS — J309 Allergic rhinitis, unspecified: Secondary | ICD-10-CM | POA: Diagnosis not present

## 2018-08-17 DIAGNOSIS — F419 Anxiety disorder, unspecified: Secondary | ICD-10-CM | POA: Diagnosis not present

## 2018-08-19 DIAGNOSIS — J301 Allergic rhinitis due to pollen: Secondary | ICD-10-CM | POA: Diagnosis not present

## 2018-08-19 NOTE — Progress Notes (Signed)
Vials exp 08-20-19

## 2018-08-28 ENCOUNTER — Ambulatory Visit (INDEPENDENT_AMBULATORY_CARE_PROVIDER_SITE_OTHER): Payer: 59

## 2018-08-28 DIAGNOSIS — J309 Allergic rhinitis, unspecified: Secondary | ICD-10-CM

## 2018-09-15 ENCOUNTER — Ambulatory Visit (INDEPENDENT_AMBULATORY_CARE_PROVIDER_SITE_OTHER): Payer: 59 | Admitting: *Deleted

## 2018-09-15 DIAGNOSIS — J309 Allergic rhinitis, unspecified: Secondary | ICD-10-CM | POA: Diagnosis not present

## 2018-09-21 ENCOUNTER — Ambulatory Visit (INDEPENDENT_AMBULATORY_CARE_PROVIDER_SITE_OTHER): Payer: 59 | Admitting: *Deleted

## 2018-09-21 DIAGNOSIS — J309 Allergic rhinitis, unspecified: Secondary | ICD-10-CM

## 2018-09-29 DIAGNOSIS — F419 Anxiety disorder, unspecified: Secondary | ICD-10-CM | POA: Diagnosis not present

## 2018-09-30 ENCOUNTER — Ambulatory Visit (INDEPENDENT_AMBULATORY_CARE_PROVIDER_SITE_OTHER): Payer: 59 | Admitting: *Deleted

## 2018-09-30 DIAGNOSIS — J309 Allergic rhinitis, unspecified: Secondary | ICD-10-CM | POA: Diagnosis not present

## 2018-10-07 ENCOUNTER — Ambulatory Visit (INDEPENDENT_AMBULATORY_CARE_PROVIDER_SITE_OTHER): Payer: 59 | Admitting: *Deleted

## 2018-10-07 DIAGNOSIS — J309 Allergic rhinitis, unspecified: Secondary | ICD-10-CM

## 2018-10-08 MED FILL — NAPROXEN 500 MG TABLET: 500 | 4 days supply | Qty: 12 | Fill #0

## 2018-10-08 MED FILL — SERTRALINE HCL 25 MG TABLET: 25 | 90 days supply | Qty: 90 | Fill #3

## 2018-10-08 MED FILL — PENICILLIN VK 500 MG TABLET: 500 | 7 days supply | Qty: 28 | Fill #0

## 2018-10-13 ENCOUNTER — Ambulatory Visit (INDEPENDENT_AMBULATORY_CARE_PROVIDER_SITE_OTHER): Payer: 59 | Admitting: *Deleted

## 2018-10-13 DIAGNOSIS — J309 Allergic rhinitis, unspecified: Secondary | ICD-10-CM

## 2018-11-02 DIAGNOSIS — F419 Anxiety disorder, unspecified: Secondary | ICD-10-CM | POA: Diagnosis not present

## 2018-11-06 ENCOUNTER — Ambulatory Visit (INDEPENDENT_AMBULATORY_CARE_PROVIDER_SITE_OTHER): Payer: 59 | Admitting: *Deleted

## 2018-11-06 DIAGNOSIS — J309 Allergic rhinitis, unspecified: Secondary | ICD-10-CM | POA: Diagnosis not present

## 2018-11-06 MED FILL — LEVOCETIRIZINE 5 MG TABLET: 5 | 90 days supply | Qty: 180 | Fill #1

## 2018-11-11 MED FILL — SHINGRIX 50 MCG SUS: 50 | 1 days supply | Qty: 1 | Fill #0

## 2018-11-24 ENCOUNTER — Ambulatory Visit (INDEPENDENT_AMBULATORY_CARE_PROVIDER_SITE_OTHER): Payer: 59 | Admitting: *Deleted

## 2018-11-24 DIAGNOSIS — J309 Allergic rhinitis, unspecified: Secondary | ICD-10-CM | POA: Diagnosis not present

## 2018-12-09 DIAGNOSIS — F419 Anxiety disorder, unspecified: Secondary | ICD-10-CM | POA: Diagnosis not present

## 2018-12-14 ENCOUNTER — Ambulatory Visit (INDEPENDENT_AMBULATORY_CARE_PROVIDER_SITE_OTHER): Payer: 59 | Admitting: *Deleted

## 2018-12-14 DIAGNOSIS — J309 Allergic rhinitis, unspecified: Secondary | ICD-10-CM

## 2018-12-30 ENCOUNTER — Ambulatory Visit (INDEPENDENT_AMBULATORY_CARE_PROVIDER_SITE_OTHER): Payer: 59

## 2018-12-30 DIAGNOSIS — J309 Allergic rhinitis, unspecified: Secondary | ICD-10-CM | POA: Diagnosis not present

## 2019-01-04 MED FILL — SERTRALINE HCL 25 MG TABLET: 25 | 90 days supply | Qty: 90 | Fill #0

## 2019-01-12 NOTE — Progress Notes (Signed)
EXP 01/13/20

## 2019-01-13 ENCOUNTER — Ambulatory Visit (INDEPENDENT_AMBULATORY_CARE_PROVIDER_SITE_OTHER): Payer: 59

## 2019-01-13 DIAGNOSIS — J309 Allergic rhinitis, unspecified: Secondary | ICD-10-CM | POA: Diagnosis not present

## 2019-01-14 DIAGNOSIS — J3089 Other allergic rhinitis: Secondary | ICD-10-CM | POA: Diagnosis not present

## 2019-01-26 MED FILL — valACYclovir HCL 1 GM TABS: 1 | 14 days supply | Qty: 28 | Fill #0

## 2019-01-27 DIAGNOSIS — F419 Anxiety disorder, unspecified: Secondary | ICD-10-CM | POA: Diagnosis not present

## 2019-01-29 MED FILL — SHINGRIX 50 MCG SUS: 50 | 1 days supply | Qty: 1 | Fill #1

## 2019-02-02 ENCOUNTER — Ambulatory Visit (INDEPENDENT_AMBULATORY_CARE_PROVIDER_SITE_OTHER): Payer: 59 | Admitting: *Deleted

## 2019-02-02 DIAGNOSIS — J309 Allergic rhinitis, unspecified: Secondary | ICD-10-CM | POA: Diagnosis not present

## 2019-02-11 ENCOUNTER — Ambulatory Visit (INDEPENDENT_AMBULATORY_CARE_PROVIDER_SITE_OTHER): Payer: 59

## 2019-02-11 DIAGNOSIS — J309 Allergic rhinitis, unspecified: Secondary | ICD-10-CM

## 2019-02-18 ENCOUNTER — Ambulatory Visit (INDEPENDENT_AMBULATORY_CARE_PROVIDER_SITE_OTHER): Payer: 59 | Admitting: *Deleted

## 2019-02-18 DIAGNOSIS — J309 Allergic rhinitis, unspecified: Secondary | ICD-10-CM

## 2019-02-24 ENCOUNTER — Ambulatory Visit (INDEPENDENT_AMBULATORY_CARE_PROVIDER_SITE_OTHER): Payer: 59 | Admitting: *Deleted

## 2019-02-24 DIAGNOSIS — J309 Allergic rhinitis, unspecified: Secondary | ICD-10-CM

## 2019-03-04 ENCOUNTER — Ambulatory Visit (INDEPENDENT_AMBULATORY_CARE_PROVIDER_SITE_OTHER): Payer: 59 | Admitting: *Deleted

## 2019-03-04 DIAGNOSIS — J309 Allergic rhinitis, unspecified: Secondary | ICD-10-CM

## 2019-03-10 MED FILL — LEVOCETIRIZINE 5 MG TABLET: 5 | 90 days supply | Qty: 180 | Fill #2

## 2019-03-10 MED FILL — SERTRALINE HCL 25 MG TABLET: 25 | 90 days supply | Qty: 90 | Fill #1

## 2019-03-11 DIAGNOSIS — F419 Anxiety disorder, unspecified: Secondary | ICD-10-CM | POA: Diagnosis not present

## 2019-03-25 ENCOUNTER — Ambulatory Visit (INDEPENDENT_AMBULATORY_CARE_PROVIDER_SITE_OTHER): Payer: 59

## 2019-03-25 DIAGNOSIS — J309 Allergic rhinitis, unspecified: Secondary | ICD-10-CM

## 2019-04-15 DIAGNOSIS — F419 Anxiety disorder, unspecified: Secondary | ICD-10-CM | POA: Diagnosis not present

## 2019-04-20 ENCOUNTER — Ambulatory Visit (INDEPENDENT_AMBULATORY_CARE_PROVIDER_SITE_OTHER): Payer: 59 | Admitting: *Deleted

## 2019-04-20 DIAGNOSIS — J309 Allergic rhinitis, unspecified: Secondary | ICD-10-CM

## 2019-05-27 ENCOUNTER — Ambulatory Visit (INDEPENDENT_AMBULATORY_CARE_PROVIDER_SITE_OTHER): Payer: 59

## 2019-05-27 DIAGNOSIS — J309 Allergic rhinitis, unspecified: Secondary | ICD-10-CM

## 2019-06-15 DIAGNOSIS — F419 Anxiety disorder, unspecified: Secondary | ICD-10-CM | POA: Diagnosis not present

## 2019-06-21 ENCOUNTER — Ambulatory Visit (INDEPENDENT_AMBULATORY_CARE_PROVIDER_SITE_OTHER): Payer: 59 | Admitting: *Deleted

## 2019-06-21 DIAGNOSIS — J309 Allergic rhinitis, unspecified: Secondary | ICD-10-CM | POA: Diagnosis not present

## 2019-06-23 DIAGNOSIS — J301 Allergic rhinitis due to pollen: Secondary | ICD-10-CM | POA: Diagnosis not present

## 2019-06-23 NOTE — Progress Notes (Signed)
VIALS EXP 06-22-2020 

## 2019-07-07 MED FILL — SERTRALINE HCL 25 MG TABLET: 25 | 90 days supply | Qty: 90 | Fill #2

## 2019-07-15 DIAGNOSIS — F419 Anxiety disorder, unspecified: Secondary | ICD-10-CM | POA: Diagnosis not present

## 2019-07-19 ENCOUNTER — Ambulatory Visit (INDEPENDENT_AMBULATORY_CARE_PROVIDER_SITE_OTHER): Payer: 59

## 2019-07-19 DIAGNOSIS — J309 Allergic rhinitis, unspecified: Secondary | ICD-10-CM

## 2019-08-24 DIAGNOSIS — F419 Anxiety disorder, unspecified: Secondary | ICD-10-CM | POA: Diagnosis not present

## 2019-08-25 ENCOUNTER — Ambulatory Visit (INDEPENDENT_AMBULATORY_CARE_PROVIDER_SITE_OTHER): Payer: 59 | Admitting: *Deleted

## 2019-08-25 DIAGNOSIS — J309 Allergic rhinitis, unspecified: Secondary | ICD-10-CM | POA: Diagnosis not present

## 2019-08-31 ENCOUNTER — Ambulatory Visit (INDEPENDENT_AMBULATORY_CARE_PROVIDER_SITE_OTHER): Payer: 59 | Admitting: *Deleted

## 2019-08-31 DIAGNOSIS — J309 Allergic rhinitis, unspecified: Secondary | ICD-10-CM

## 2019-09-02 MED FILL — valACYclovir HCL 1 GM TABS: 1 | 14 days supply | Qty: 28 | Fill #1

## 2019-09-08 ENCOUNTER — Ambulatory Visit (INDEPENDENT_AMBULATORY_CARE_PROVIDER_SITE_OTHER): Payer: 59 | Admitting: *Deleted

## 2019-09-08 DIAGNOSIS — J309 Allergic rhinitis, unspecified: Secondary | ICD-10-CM | POA: Diagnosis not present

## 2019-09-14 ENCOUNTER — Other Ambulatory Visit: Payer: Self-pay | Admitting: Occupational Medicine

## 2019-09-14 ENCOUNTER — Ambulatory Visit (INDEPENDENT_AMBULATORY_CARE_PROVIDER_SITE_OTHER): Payer: 59 | Admitting: *Deleted

## 2019-09-14 DIAGNOSIS — J309 Allergic rhinitis, unspecified: Secondary | ICD-10-CM

## 2019-09-15 LAB — LIPID PANEL
Cholesterol: 186 mg/dL (ref <200)
HDL: 64 mg/dL
LDL Cholesterol (Calc): 105 mg/dL (calc) — ABNORMAL HIGH
Non-HDL Cholesterol (Calc): 122 mg/dL (calc) (ref <130)
Total CHOL/HDL Ratio: 2.9 (calc) (ref <5.0)
Triglycerides: 82 mg/dL (ref <150)

## 2019-09-15 LAB — COMPLETE METABOLIC PANEL WITH GFR
AG Ratio: 1.8 (calc) (ref 1.0–2.5)
ALT: 15 U/L
AST: 21 U/L (ref 10–35)
Albumin: 4.6 g/dL (ref 3.6–5.1)
Alkaline phosphatase (APISO): 51 U/L
BUN: 21 mg/dL (ref 7–25)
CO2: 28 mmol/L (ref 20–32)
Calcium: 8.7 mg/dL
Chloride: 103 mmol/L (ref 98–110)
Creat: 0.89 mg/dL
Globulin: 2.5 g/dL (calc) (ref 1.9–3.7)
Glucose, Bld: 83 mg/dL (ref 65–99)
Potassium: 4.1 mmol/L (ref 3.5–5.3)
Sodium: 141 mmol/L (ref 135–146)
Total Bilirubin: 0.7 mg/dL (ref 0.2–1.2)
Total Protein: 7.1 g/dL (ref 6.1–8.1)

## 2019-09-15 LAB — PSA: PSA: 0.4 ng/mL

## 2019-09-15 LAB — CBC WITH DIFFERENTIAL/PLATELET
Absolute Monocytes: 556 cells/uL (ref 200–950)
Basophils Absolute: 22 cells/uL (ref 0–200)
Basophils Relative: 0.4 %
Eosinophils Absolute: 231 cells/uL (ref 15–500)
Eosinophils Relative: 4.2 %
HCT: 43.4 % (ref 38.5–45.0)
Hemoglobin: 15 g/dL (ref 13.2–15.5)
Lymphs Abs: 1381 cells/uL (ref 850–3900)
MCH: 30.3 pg (ref 27.0–33.0)
MCHC: 34.6 g/dL (ref 32.0–36.0)
MCV: 87.7 fL (ref 80.0–100.0)
MPV: 10.8 fL (ref 7.5–12.5)
Monocytes Relative: 10.1 %
Neutro Abs: 3311 cells/uL (ref 1500–7800)
Neutrophils Relative %: 60.2 %
Platelets: 178 10*3/uL (ref 140–400)
RBC: 4.95 10*6/uL (ref 4.20–5.10)
RDW: 12.4 % (ref 11.0–15.0)
Total Lymphocyte: 25.1 %
WBC: 5.5 10*3/uL (ref 3.8–10.8)

## 2019-09-21 ENCOUNTER — Ambulatory Visit (INDEPENDENT_AMBULATORY_CARE_PROVIDER_SITE_OTHER): Payer: 59

## 2019-09-21 DIAGNOSIS — J309 Allergic rhinitis, unspecified: Secondary | ICD-10-CM

## 2019-09-29 ENCOUNTER — Ambulatory Visit: Payer: Self-pay | Admitting: *Deleted

## 2019-10-06 ENCOUNTER — Other Ambulatory Visit: Payer: Self-pay | Admitting: Occupational Medicine

## 2019-10-07 LAB — LIPID PANEL
Cholesterol: 157 mg/dL (ref <200)
HDL: 64 mg/dL
LDL Cholesterol (Calc): 81 mg/dL (calc)
Non-HDL Cholesterol (Calc): 93 mg/dL (calc) (ref <130)
Total CHOL/HDL Ratio: 2.5 (calc) (ref <5.0)
Triglycerides: 42 mg/dL (ref <150)

## 2019-10-07 MED FILL — SERTRALINE HCL 25 MG TABLET: 25 | 90 days supply | Qty: 90 | Fill #3

## 2019-10-12 DIAGNOSIS — F419 Anxiety disorder, unspecified: Secondary | ICD-10-CM | POA: Diagnosis not present

## 2019-10-19 ENCOUNTER — Ambulatory Visit (INDEPENDENT_AMBULATORY_CARE_PROVIDER_SITE_OTHER): Payer: 59

## 2019-10-19 DIAGNOSIS — J309 Allergic rhinitis, unspecified: Secondary | ICD-10-CM

## 2019-10-25 MED FILL — LEVOCETIRIZINE 5 MG TABLET: 5 | 90 days supply | Qty: 180 | Fill #0

## 2019-11-09 DIAGNOSIS — Z20828 Contact with and (suspected) exposure to other viral communicable diseases: Secondary | ICD-10-CM | POA: Diagnosis not present

## 2019-11-09 DIAGNOSIS — R519 Headache, unspecified: Secondary | ICD-10-CM | POA: Diagnosis not present

## 2019-11-09 DIAGNOSIS — M791 Myalgia, unspecified site: Secondary | ICD-10-CM | POA: Diagnosis not present

## 2019-11-23 ENCOUNTER — Ambulatory Visit (INDEPENDENT_AMBULATORY_CARE_PROVIDER_SITE_OTHER): Payer: 59 | Admitting: *Deleted

## 2019-11-23 DIAGNOSIS — J309 Allergic rhinitis, unspecified: Secondary | ICD-10-CM

## 2019-12-07 DIAGNOSIS — F419 Anxiety disorder, unspecified: Secondary | ICD-10-CM | POA: Diagnosis not present

## 2020-01-04 ENCOUNTER — Ambulatory Visit (INDEPENDENT_AMBULATORY_CARE_PROVIDER_SITE_OTHER): Payer: 59

## 2020-01-04 DIAGNOSIS — J309 Allergic rhinitis, unspecified: Secondary | ICD-10-CM

## 2020-01-17 MED FILL — SERTRALINE HCL 25 MG TABLET: 25 | 90 days supply | Qty: 90 | Fill #0

## 2020-01-25 ENCOUNTER — Ambulatory Visit (INDEPENDENT_AMBULATORY_CARE_PROVIDER_SITE_OTHER): Payer: 59

## 2020-01-25 DIAGNOSIS — J309 Allergic rhinitis, unspecified: Secondary | ICD-10-CM | POA: Diagnosis not present

## 2020-01-31 DIAGNOSIS — F419 Anxiety disorder, unspecified: Secondary | ICD-10-CM | POA: Diagnosis not present

## 2020-02-03 MED FILL — valACYclovir HCL 1 GM TABS: 1 | 14 days supply | Qty: 28 | Fill #0

## 2020-02-09 ENCOUNTER — Telehealth: Payer: Self-pay | Admitting: Allergy and Immunology

## 2020-02-09 NOTE — Telephone Encounter (Signed)
Called and left voicemail asking for patient to return call to gather more information about Hives outbreak.

## 2020-02-09 NOTE — Telephone Encounter (Signed)
Patient called and began having hives last night and would like a steroid pack sent to the [facility].   Please advise.

## 2020-02-10 ENCOUNTER — Other Ambulatory Visit: Payer: Self-pay

## 2020-02-10 MED ORDER — PREDNISONE 10 MG PO TABS: ORAL_TABLET | ORAL | 0 refills | Status: DC

## 2020-02-10 MED FILL — predniSONE 10 MG TABS: 10 | 10 days supply | Qty: 10 | Fill #0

## 2020-02-10 NOTE — Telephone Encounter (Signed)
Patient informed and [facility] sent to pharmacy.  Appointment scheduled for April.

## 2020-02-10 NOTE — Telephone Encounter (Signed)
Please provide patient prednisone 10 mg tablet 1 time per day for 10 days and have him make a return visit to our clinic as it has been over a year since his last visit.

## 2020-02-16 DIAGNOSIS — J301 Allergic rhinitis due to pollen: Secondary | ICD-10-CM | POA: Diagnosis not present

## 2020-02-16 NOTE — Progress Notes (Signed)
Vials exp 02-15-21

## 2020-02-22 DIAGNOSIS — J3089 Other allergic rhinitis: Secondary | ICD-10-CM | POA: Diagnosis not present

## 2020-02-22 DIAGNOSIS — F419 Anxiety disorder, unspecified: Secondary | ICD-10-CM | POA: Diagnosis not present

## 2020-02-22 DIAGNOSIS — L80 Vitiligo: Secondary | ICD-10-CM | POA: Diagnosis not present

## 2020-02-22 DIAGNOSIS — Z0000 Encounter for general adult medical examination without abnormal findings: Secondary | ICD-10-CM | POA: Diagnosis not present

## 2020-02-24 ENCOUNTER — Ambulatory Visit (INDEPENDENT_AMBULATORY_CARE_PROVIDER_SITE_OTHER): Payer: 59

## 2020-02-24 DIAGNOSIS — J309 Allergic rhinitis, unspecified: Secondary | ICD-10-CM

## 2020-03-02 DIAGNOSIS — F419 Anxiety disorder, unspecified: Secondary | ICD-10-CM | POA: Diagnosis not present

## 2020-03-28 ENCOUNTER — Ambulatory Visit (INDEPENDENT_AMBULATORY_CARE_PROVIDER_SITE_OTHER): Payer: 59

## 2020-03-28 DIAGNOSIS — J309 Allergic rhinitis, unspecified: Secondary | ICD-10-CM | POA: Diagnosis not present

## 2020-04-04 ENCOUNTER — Encounter: Payer: Self-pay | Admitting: Allergy and Immunology

## 2020-04-04 ENCOUNTER — Other Ambulatory Visit: Payer: Self-pay

## 2020-04-04 ENCOUNTER — Ambulatory Visit: Payer: 59 | Admitting: Allergy and Immunology

## 2020-04-04 VITALS — BP 100/64 | HR 97 | Temp 97.2°F | Resp 18

## 2020-04-04 DIAGNOSIS — L500 Allergic urticaria: Secondary | ICD-10-CM | POA: Diagnosis not present

## 2020-04-04 DIAGNOSIS — J3089 Other allergic rhinitis: Secondary | ICD-10-CM | POA: Diagnosis not present

## 2020-04-04 MED FILL — SERTRALINE HCL 25 MG TABLET: 25 | 90 days supply | Qty: 90 | Fill #1

## 2020-04-04 NOTE — Progress Notes (Signed)
[facility]   Follow-up Note  Referring Provider: Lavone Orn, MD Primary Provider: Lavone Orn, MD Date of Office Visit: 04/04/2020  Subjective:   Randy Meyer (DOB: [birth year]) is a 54 y.o. 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  His last visit to this clinic was several years ago.  He has had excellent control while utilizing immunotherapy currently at every 4 weeks without any adverse effect.  His nasal allergies are under excellent control while utilizing Flonase and taking an antihistamine and he has not had any significant episodes of urticaria other than 1 outbreak with unknown etiologic factor that occurred in February 2021 requiring the administration of a low-dose systemic steroid for a few days.  He can now have exposure to his cat without any difficulty.  He has received 2 Pfizer Covid vaccinations and is now in [employer] "booster study".  Allergies as of 04/04/2020      Reactions   Aspirin       Medication List    EpiPen 2-Pak 0.3 mg/0.3 mL Soaj injection Generic drug: EPINEPHrine INJECT AS DIRECTED   Fish Oil 1000 MG Caps Take by mouth daily.   loratadine 10 MG tablet Commonly known as: CLARITIN Take 10 mg by mouth daily.   mometasone 50 MCG/ACT nasal spray Commonly known as: NASONEX Place 1 spray into the nose daily.   sertraline 25 MG tablet Commonly known as: ZOLOFT Take 25 mg by mouth daily.   XYZAL PO Take by mouth.       Past Medical History:  Diagnosis Date  . Allergy   . Anxiety   . Depression   . History of anal fissures     Past Surgical History:  Procedure Laterality Date  . MOUTH SURGERY    . VASECTOMY    . WISDOM TOOTH EXTRACTION      Review of systems negative except as  noted in HPI / PMHx or noted below:  Review of Systems  Constitutional: Negative.   HENT: Negative.   Eyes: Negative.   Respiratory: Negative.   Cardiovascular: Negative.   Gastrointestinal: Negative.   Genitourinary: Negative.   Musculoskeletal: Negative.   Skin: Negative.   Neurological: Negative.   Endo/Heme/Allergies: Negative.   Psychiatric/Behavioral: Negative.      Objective:   Vitals:   04/04/20 1125  BP: 100/64  Pulse: 97  Resp: 18  Temp: (!) 97.2 F (36.2 C)  SpO2: 97%          Physical Exam Constitutional:      Appearance: He is not diaphoretic.  HENT:     Head: Normocephalic.     Right Ear: Tympanic membrane, ear canal and external ear normal.     Left Ear: Tympanic membrane, ear canal and external ear normal.     Nose: Nose normal. No mucosal edema or rhinorrhea.     Mouth/Throat:     Pharynx: Uvula midline. No oropharyngeal exudate.  Eyes:     Conjunctiva/sclera: Conjunctivae normal.  Neck:     Thyroid: No thyromegaly.     Trachea: Trachea normal. No tracheal tenderness or tracheal deviation.  Cardiovascular:     Rate and Rhythm: Normal rate and regular rhythm.     Heart sounds: Normal heart sounds, S1 normal and S2 normal. No murmur.  Pulmonary:     Effort: No respiratory distress.     Breath sounds: Normal breath sounds. No stridor. No wheezing or rales.  Lymphadenopathy:     Head:     Right side of head: No tonsillar adenopathy.     Left side of head: No tonsillar adenopathy.     Cervical: No cervical adenopathy.  Skin:    Findings: No erythema or rash.     Nails: There is no clubbing.  Neurological:     Mental Status: He is alert.     Diagnostics: none   Assessment and Plan:   1. Perennial allergic rhinitis   2. Allergic urticaria     1.  Continue immunotherapy  2.  Continue Flonase and antihistamine  3.  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  Allena Katz, MD Allergy / Immunology [place]

## 2020-04-04 NOTE — Patient Instructions (Signed)
  1.  Continue immunotherapy  2.  Continue Flonase and antihistamine  3.  Return to clinic in 1 year or earlier if problem

## 2020-04-05 ENCOUNTER — Encounter: Payer: Self-pay | Admitting: Allergy and Immunology

## 2020-04-13 DIAGNOSIS — F419 Anxiety disorder, unspecified: Secondary | ICD-10-CM | POA: Diagnosis not present

## 2020-04-24 MED FILL — PENICILLIN VK 500 MG TABLET: 500 | 7 days supply | Qty: 28 | Fill #0

## 2020-04-26 ENCOUNTER — Ambulatory Visit (INDEPENDENT_AMBULATORY_CARE_PROVIDER_SITE_OTHER): Payer: 59

## 2020-04-26 DIAGNOSIS — J3089 Other allergic rhinitis: Secondary | ICD-10-CM

## 2020-04-27 MED FILL — DOXYCYCLINE HYCLATE 100 MG: 100 | 10 days supply | Qty: 20 | Fill #0

## 2020-05-02 ENCOUNTER — Ambulatory Visit (INDEPENDENT_AMBULATORY_CARE_PROVIDER_SITE_OTHER): Payer: 59

## 2020-05-02 DIAGNOSIS — J3089 Other allergic rhinitis: Secondary | ICD-10-CM | POA: Diagnosis not present

## 2020-05-09 ENCOUNTER — Ambulatory Visit (INDEPENDENT_AMBULATORY_CARE_PROVIDER_SITE_OTHER): Payer: 59

## 2020-05-09 DIAGNOSIS — J3089 Other allergic rhinitis: Secondary | ICD-10-CM | POA: Diagnosis not present

## 2020-05-11 DIAGNOSIS — F419 Anxiety disorder, unspecified: Secondary | ICD-10-CM | POA: Diagnosis not present

## 2020-05-15 ENCOUNTER — Ambulatory Visit (INDEPENDENT_AMBULATORY_CARE_PROVIDER_SITE_OTHER): Payer: 59

## 2020-05-15 DIAGNOSIS — J309 Allergic rhinitis, unspecified: Secondary | ICD-10-CM | POA: Diagnosis not present

## 2020-05-15 MED FILL — LEVOCETIRIZINE 5 MG TABLET: 5 | 90 days supply | Qty: 180 | Fill #1

## 2020-05-23 MED FILL — valACYclovir HCL 1 GM TABS: 1 | 14 days supply | Qty: 28 | Fill #1

## 2020-05-24 DIAGNOSIS — L80 Vitiligo: Secondary | ICD-10-CM | POA: Diagnosis not present

## 2020-05-24 DIAGNOSIS — D2261 Melanocytic nevi of right upper limb, including shoulder: Secondary | ICD-10-CM | POA: Diagnosis not present

## 2020-05-24 DIAGNOSIS — D2271 Melanocytic nevi of right lower limb, including hip: Secondary | ICD-10-CM | POA: Diagnosis not present

## 2020-05-24 DIAGNOSIS — D225 Melanocytic nevi of trunk: Secondary | ICD-10-CM | POA: Diagnosis not present

## 2020-05-24 DIAGNOSIS — D2272 Melanocytic nevi of left lower limb, including hip: Secondary | ICD-10-CM | POA: Diagnosis not present

## 2020-05-24 DIAGNOSIS — L819 Disorder of pigmentation, unspecified: Secondary | ICD-10-CM | POA: Diagnosis not present

## 2020-05-24 MED FILL — TACROLIMUS 0.1% OINTMENT: 0.1 | 15 days supply | Qty: 30 | Fill #0

## 2020-05-25 ENCOUNTER — Ambulatory Visit (INDEPENDENT_AMBULATORY_CARE_PROVIDER_SITE_OTHER): Payer: 59

## 2020-05-25 DIAGNOSIS — J309 Allergic rhinitis, unspecified: Secondary | ICD-10-CM

## 2020-06-28 ENCOUNTER — Ambulatory Visit (INDEPENDENT_AMBULATORY_CARE_PROVIDER_SITE_OTHER): Payer: 59

## 2020-06-28 DIAGNOSIS — J309 Allergic rhinitis, unspecified: Secondary | ICD-10-CM | POA: Diagnosis not present

## 2020-06-28 MED FILL — SERTRALINE HCL 25 MG TABLET: 25 | 90 days supply | Qty: 90 | Fill #2

## 2020-06-29 DIAGNOSIS — F419 Anxiety disorder, unspecified: Secondary | ICD-10-CM | POA: Diagnosis not present

## 2020-07-27 ENCOUNTER — Ambulatory Visit (INDEPENDENT_AMBULATORY_CARE_PROVIDER_SITE_OTHER): Payer: 59

## 2020-07-27 DIAGNOSIS — J309 Allergic rhinitis, unspecified: Secondary | ICD-10-CM

## 2020-08-30 ENCOUNTER — Ambulatory Visit (INDEPENDENT_AMBULATORY_CARE_PROVIDER_SITE_OTHER): Payer: 59

## 2020-08-30 DIAGNOSIS — J309 Allergic rhinitis, unspecified: Secondary | ICD-10-CM | POA: Diagnosis not present

## 2020-09-05 DIAGNOSIS — F419 Anxiety disorder, unspecified: Secondary | ICD-10-CM | POA: Diagnosis not present

## 2020-09-12 MED FILL — SERTRALINE HCL 25 MG TABLET: 25 | 90 days supply | Qty: 90 | Fill #3

## 2020-09-26 ENCOUNTER — Ambulatory Visit (INDEPENDENT_AMBULATORY_CARE_PROVIDER_SITE_OTHER): Payer: 59 | Admitting: *Deleted

## 2020-09-26 DIAGNOSIS — J309 Allergic rhinitis, unspecified: Secondary | ICD-10-CM

## 2020-09-26 MED FILL — SERTRALINE HCL 25 MG TABLET: 25 | 90 days supply | Qty: 90 | Fill #3

## 2020-10-23 DIAGNOSIS — J301 Allergic rhinitis due to pollen: Secondary | ICD-10-CM | POA: Diagnosis not present

## 2020-10-23 NOTE — Progress Notes (Signed)
VIALS EXP 10-23-21 °

## 2020-10-27 ENCOUNTER — Other Ambulatory Visit (HOSPITAL_COMMUNITY): Payer: Self-pay | Admitting: Internal Medicine

## 2020-10-27 MED FILL — LEVOCETIRIZINE 5 MG TABLET: 5 | 90 days supply | Qty: 180 | Fill #0

## 2020-10-30 DIAGNOSIS — F419 Anxiety disorder, unspecified: Secondary | ICD-10-CM | POA: Diagnosis not present

## 2020-11-01 ENCOUNTER — Ambulatory Visit (INDEPENDENT_AMBULATORY_CARE_PROVIDER_SITE_OTHER): Payer: 59 | Admitting: *Deleted

## 2020-11-01 DIAGNOSIS — J309 Allergic rhinitis, unspecified: Secondary | ICD-10-CM | POA: Diagnosis not present

## 2020-11-20 ENCOUNTER — Other Ambulatory Visit (HOSPITAL_COMMUNITY): Payer: Self-pay | Admitting: Dentistry

## 2020-11-20 MED FILL — PENICILLIN VK 500 MG TABLET: 500 | 7 days supply | Qty: 28 | Fill #0

## 2020-11-27 ENCOUNTER — Ambulatory Visit (INDEPENDENT_AMBULATORY_CARE_PROVIDER_SITE_OTHER): Payer: 59 | Admitting: *Deleted

## 2020-11-27 DIAGNOSIS — J309 Allergic rhinitis, unspecified: Secondary | ICD-10-CM

## 2020-12-04 DIAGNOSIS — F419 Anxiety disorder, unspecified: Secondary | ICD-10-CM | POA: Diagnosis not present

## 2020-12-27 ENCOUNTER — Ambulatory Visit (INDEPENDENT_AMBULATORY_CARE_PROVIDER_SITE_OTHER): Payer: 59

## 2020-12-27 DIAGNOSIS — J309 Allergic rhinitis, unspecified: Secondary | ICD-10-CM

## 2021-01-01 ENCOUNTER — Ambulatory Visit (INDEPENDENT_AMBULATORY_CARE_PROVIDER_SITE_OTHER): Payer: 59

## 2021-01-01 DIAGNOSIS — J309 Allergic rhinitis, unspecified: Secondary | ICD-10-CM | POA: Diagnosis not present

## 2021-01-03 ENCOUNTER — Other Ambulatory Visit (HOSPITAL_COMMUNITY): Payer: Self-pay | Admitting: Internal Medicine

## 2021-01-03 MED FILL — SERTRALINE HCL 25 MG TABLET: 25 | 90 days supply | Qty: 90 | Fill #0

## 2021-01-11 ENCOUNTER — Ambulatory Visit (INDEPENDENT_AMBULATORY_CARE_PROVIDER_SITE_OTHER): Payer: 59 | Admitting: *Deleted

## 2021-01-11 DIAGNOSIS — J309 Allergic rhinitis, unspecified: Secondary | ICD-10-CM

## 2021-01-16 ENCOUNTER — Ambulatory Visit (INDEPENDENT_AMBULATORY_CARE_PROVIDER_SITE_OTHER): Payer: 59

## 2021-01-16 DIAGNOSIS — J309 Allergic rhinitis, unspecified: Secondary | ICD-10-CM

## 2021-01-23 ENCOUNTER — Ambulatory Visit (INDEPENDENT_AMBULATORY_CARE_PROVIDER_SITE_OTHER): Payer: 59

## 2021-01-23 DIAGNOSIS — J309 Allergic rhinitis, unspecified: Secondary | ICD-10-CM

## 2021-02-15 ENCOUNTER — Other Ambulatory Visit (HOSPITAL_COMMUNITY): Payer: Self-pay | Admitting: Internal Medicine

## 2021-02-15 MED FILL — valACYclovir HCL 1 GM TABS: 1 | 12 days supply | Qty: 28 | Fill #0

## 2021-02-20 ENCOUNTER — Ambulatory Visit (INDEPENDENT_AMBULATORY_CARE_PROVIDER_SITE_OTHER): Payer: 59

## 2021-02-20 DIAGNOSIS — J309 Allergic rhinitis, unspecified: Secondary | ICD-10-CM | POA: Diagnosis not present

## 2021-02-22 DIAGNOSIS — F419 Anxiety disorder, unspecified: Secondary | ICD-10-CM | POA: Diagnosis not present

## 2021-03-21 MED FILL — valACYclovir HCL 1 GM TABS: 1 | 12 days supply | Qty: 28 | Fill #1

## 2021-03-21 MED FILL — SERTRALINE HCL 25 MG TABLET: 25 | 90 days supply | Qty: 90 | Fill #1

## 2021-03-23 ENCOUNTER — Ambulatory Visit (INDEPENDENT_AMBULATORY_CARE_PROVIDER_SITE_OTHER): Payer: 59

## 2021-03-23 DIAGNOSIS — J309 Allergic rhinitis, unspecified: Secondary | ICD-10-CM

## 2021-04-10 ENCOUNTER — Ambulatory Visit: Payer: 59 | Admitting: Allergy and Immunology

## 2021-04-19 DIAGNOSIS — F419 Anxiety disorder, unspecified: Secondary | ICD-10-CM | POA: Diagnosis not present

## 2021-04-30 ENCOUNTER — Ambulatory Visit (INDEPENDENT_AMBULATORY_CARE_PROVIDER_SITE_OTHER): Payer: 59

## 2021-04-30 DIAGNOSIS — J309 Allergic rhinitis, unspecified: Secondary | ICD-10-CM

## 2021-05-01 ENCOUNTER — Other Ambulatory Visit: Payer: Self-pay

## 2021-05-01 ENCOUNTER — Ambulatory Visit (INDEPENDENT_AMBULATORY_CARE_PROVIDER_SITE_OTHER): Payer: 59 | Admitting: Allergy and Immunology

## 2021-05-01 ENCOUNTER — Encounter: Payer: Self-pay | Admitting: Allergy and Immunology

## 2021-05-01 VITALS — BP 104/70 | HR 63 | Temp 97.6°F | Resp 18 | Ht 69.0 in | Wt 161.4 lb

## 2021-05-01 DIAGNOSIS — J3089 Other allergic rhinitis: Secondary | ICD-10-CM

## 2021-05-01 DIAGNOSIS — L500 Allergic urticaria: Secondary | ICD-10-CM | POA: Diagnosis not present

## 2021-05-01 DIAGNOSIS — H9312 Tinnitus, left ear: Secondary | ICD-10-CM

## 2021-05-01 NOTE — Progress Notes (Signed)
[facility]   Follow-up Note  Referring Provider: Lavone Orn, MD Primary Provider: Lavone Orn, MD Date of Office Visit: 05/01/2021  Subjective:   Randy Meyer (DOB: February 24, 1966) is a 55 y.o. 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  His last visit to this clinic was 04 April 2020.  He has really done well utilizing a course of immunotherapy currently at every 4 weeks without any adverse effect.  He does use some Flonase and antihistamine but for the most part has really had very little issues involving either his skin or his upper airway while utilizing this approach to his immune activation.  He does relate a history of developing crackling in his left ear which is an intermittent issue.  He has received 3 [place] vaccines.  Allergies as of 05/01/2021      Reactions   Aspirin       Medication List    acyclovir cream 5 % Commonly known as: ZOVIRAX 1 application   EpiPen 2-Pak 0.3 mg/0.3 mL Soaj injection Generic drug: EPINEPHrine INJECT AS DIRECTED   Fish Oil 1000 MG Caps Take by mouth daily.   fluticasone 50 MCG/ACT nasal spray Commonly known as: FLONASE 1 spray in each nostril   loratadine 10 MG tablet Commonly known as: CLARITIN Take 10 mg by mouth daily.   sertraline 25 MG tablet Commonly known as: ZOLOFT Take 25 mg by mouth daily.   sertraline 25 MG tablet Commonly known as: ZOLOFT TAKE 1 TABLET BY MOUTH ONCE A DAY   XYZAL PO Take by mouth.       Past Medical History:  Diagnosis Date  . Allergy   . Anxiety   . Depression   . History of anal fissures     Past Surgical History:  Procedure Laterality Date  . MOUTH SURGERY    . VASECTOMY    . WISDOM TOOTH  EXTRACTION      Review of systems negative except as noted in HPI / PMHx or noted below:  Review of Systems  Constitutional: Negative.   HENT: Negative.   Eyes: Negative.   Respiratory: Negative.   Cardiovascular: Negative.   Gastrointestinal: Negative.   Genitourinary: Negative.   Musculoskeletal: Negative.   Skin: Negative.   Neurological: Negative.   Endo/Heme/Allergies: Negative.   Psychiatric/Behavioral: Negative.      Objective:   Vitals:   05/01/21 1143  BP: 104/70  Pulse: 63  Resp: 18  Temp: 97.6 F (36.4 C)  SpO2: 98%   Height: 5\' 9"  (175.3 cm)  Weight: 161 lb 6.4 oz (73.2 kg)   Physical Exam Constitutional:      Appearance: He is not diaphoretic.  HENT:     Head: Normocephalic.     Right Ear: Tympanic membrane, ear canal and external ear normal.     Left Ear: Tympanic membrane, ear canal and external ear normal.     Nose: Nose normal. No mucosal edema or rhinorrhea.     Mouth/Throat:     Pharynx: Uvula midline. No oropharyngeal exudate.  Eyes:     Conjunctiva/sclera: Conjunctivae normal.  Neck:     Thyroid: No thyromegaly.     Trachea: Trachea normal. No tracheal tenderness or tracheal deviation.  Cardiovascular:     Rate and Rhythm:  Normal rate and regular rhythm.     Heart sounds: Normal heart sounds, S1 normal and S2 normal. No murmur heard.   Pulmonary:     Effort: No respiratory distress.     Breath sounds: Normal breath sounds. No stridor. No wheezing or rales.  Lymphadenopathy:     Head:     Right side of head: No tonsillar adenopathy.     Left side of head: No tonsillar adenopathy.     Cervical: No cervical adenopathy.  Skin:    Findings: No erythema or rash.     Nails: There is no clubbing.  Neurological:     Mental Status: He is alert.     Diagnostics: none  Assessment and Plan:   1. Perennial allergic rhinitis   2. Allergic urticaria   3. Tinnitus, left     1.  Continue immunotherapy  2.  Continue Flonase and  antihistamine  3.  Auvi-Q 0.3 if needed  4. Evaluation with Dr. Lucia Gaskins for tinnitus  5.  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  We will refer him onto ENT for his tinnitus.  I will see him back in his clinic in 1 year or earlier if there is a problem.  Allena Katz, MD Allergy / Immunology [facility]

## 2021-05-01 NOTE — Patient Instructions (Signed)
  1.  Continue immunotherapy  2.  Continue Flonase and antihistamine  3.  Auvi-Q 0.3 if needed  4. Evaluation with Dr. Lucia Gaskins for tinnitus  5.  Return to clinic in 1 year or earlier if problem

## 2021-05-02 ENCOUNTER — Encounter: Payer: Self-pay | Admitting: Allergy and Immunology

## 2021-05-02 DIAGNOSIS — H524 Presbyopia: Secondary | ICD-10-CM | POA: Diagnosis not present

## 2021-05-09 ENCOUNTER — Other Ambulatory Visit (HOSPITAL_COMMUNITY): Payer: Self-pay

## 2021-05-09 MED FILL — Levocetirizine Dihydrochloride Tab 5 MG: ORAL | 90 days supply | Qty: 180 | Fill #0 | Status: AC

## 2021-05-31 ENCOUNTER — Ambulatory Visit (INDEPENDENT_AMBULATORY_CARE_PROVIDER_SITE_OTHER): Payer: 59 | Admitting: *Deleted

## 2021-05-31 DIAGNOSIS — J309 Allergic rhinitis, unspecified: Secondary | ICD-10-CM

## 2021-05-31 DIAGNOSIS — F419 Anxiety disorder, unspecified: Secondary | ICD-10-CM | POA: Diagnosis not present

## 2021-06-01 DIAGNOSIS — T162XXA Foreign body in left ear, initial encounter: Secondary | ICD-10-CM | POA: Diagnosis not present

## 2021-06-01 DIAGNOSIS — T161XXA Foreign body in right ear, initial encounter: Secondary | ICD-10-CM | POA: Diagnosis not present

## 2021-06-01 DIAGNOSIS — H903 Sensorineural hearing loss, bilateral: Secondary | ICD-10-CM | POA: Diagnosis not present

## 2021-06-01 DIAGNOSIS — J343 Hypertrophy of nasal turbinates: Secondary | ICD-10-CM | POA: Diagnosis not present

## 2021-06-06 DIAGNOSIS — J301 Allergic rhinitis due to pollen: Secondary | ICD-10-CM | POA: Diagnosis not present

## 2021-06-06 NOTE — Progress Notes (Signed)
EXP  06/06/22 

## 2021-06-28 ENCOUNTER — Ambulatory Visit (INDEPENDENT_AMBULATORY_CARE_PROVIDER_SITE_OTHER): Payer: 59 | Admitting: *Deleted

## 2021-06-28 DIAGNOSIS — J309 Allergic rhinitis, unspecified: Secondary | ICD-10-CM

## 2021-07-04 ENCOUNTER — Other Ambulatory Visit: Payer: 59

## 2021-07-09 ENCOUNTER — Other Ambulatory Visit (HOSPITAL_COMMUNITY): Payer: Self-pay

## 2021-07-09 MED FILL — Sertraline HCl Tab 25 MG: ORAL | 90 days supply | Qty: 90 | Fill #0 | Status: AC

## 2021-07-26 ENCOUNTER — Ambulatory Visit (INDEPENDENT_AMBULATORY_CARE_PROVIDER_SITE_OTHER): Payer: 59

## 2021-07-26 DIAGNOSIS — J309 Allergic rhinitis, unspecified: Secondary | ICD-10-CM

## 2021-08-02 DIAGNOSIS — F419 Anxiety disorder, unspecified: Secondary | ICD-10-CM | POA: Diagnosis not present

## 2021-08-21 DIAGNOSIS — L80 Vitiligo: Secondary | ICD-10-CM | POA: Diagnosis not present

## 2021-08-21 DIAGNOSIS — D225 Melanocytic nevi of trunk: Secondary | ICD-10-CM | POA: Diagnosis not present

## 2021-08-21 DIAGNOSIS — L814 Other melanin hyperpigmentation: Secondary | ICD-10-CM | POA: Diagnosis not present

## 2021-08-21 DIAGNOSIS — L820 Inflamed seborrheic keratosis: Secondary | ICD-10-CM | POA: Diagnosis not present

## 2021-08-24 ENCOUNTER — Ambulatory Visit (INDEPENDENT_AMBULATORY_CARE_PROVIDER_SITE_OTHER): Payer: 59

## 2021-08-24 DIAGNOSIS — J309 Allergic rhinitis, unspecified: Secondary | ICD-10-CM

## 2021-08-30 ENCOUNTER — Ambulatory Visit (INDEPENDENT_AMBULATORY_CARE_PROVIDER_SITE_OTHER): Payer: 59

## 2021-08-30 DIAGNOSIS — J309 Allergic rhinitis, unspecified: Secondary | ICD-10-CM | POA: Diagnosis not present

## 2021-09-05 ENCOUNTER — Ambulatory Visit (INDEPENDENT_AMBULATORY_CARE_PROVIDER_SITE_OTHER): Payer: 59

## 2021-09-05 DIAGNOSIS — J309 Allergic rhinitis, unspecified: Secondary | ICD-10-CM | POA: Diagnosis not present

## 2021-09-06 DIAGNOSIS — F419 Anxiety disorder, unspecified: Secondary | ICD-10-CM | POA: Diagnosis not present

## 2021-09-19 ENCOUNTER — Ambulatory Visit (INDEPENDENT_AMBULATORY_CARE_PROVIDER_SITE_OTHER): Payer: 59 | Admitting: *Deleted

## 2021-09-19 DIAGNOSIS — J309 Allergic rhinitis, unspecified: Secondary | ICD-10-CM | POA: Diagnosis not present

## 2021-09-26 ENCOUNTER — Other Ambulatory Visit (HOSPITAL_COMMUNITY): Payer: Self-pay

## 2021-09-26 MED ORDER — ONDANSETRON 8 MG PO TBDP
8.0000 mg | ORAL_TABLET | Freq: Two times a day (BID) | ORAL | 1 refills | Status: DC
  Filled 2021-09-26: qty 20, 10d supply, fill #0
  Filled 2022-01-21: qty 20, 10d supply, fill #1

## 2021-09-26 MED ORDER — CELECOXIB 200 MG PO CAPS
200.0000 mg | ORAL_CAPSULE | Freq: Every day | ORAL | 0 refills | Status: DC
  Filled 2021-09-26: qty 30, 30d supply, fill #0

## 2021-09-27 ENCOUNTER — Other Ambulatory Visit (HOSPITAL_COMMUNITY): Payer: Self-pay

## 2021-09-27 ENCOUNTER — Ambulatory Visit (INDEPENDENT_AMBULATORY_CARE_PROVIDER_SITE_OTHER): Payer: 59 | Admitting: *Deleted

## 2021-09-27 DIAGNOSIS — J309 Allergic rhinitis, unspecified: Secondary | ICD-10-CM

## 2021-10-01 ENCOUNTER — Ambulatory Visit (INDEPENDENT_AMBULATORY_CARE_PROVIDER_SITE_OTHER): Payer: 59

## 2021-10-01 DIAGNOSIS — J309 Allergic rhinitis, unspecified: Secondary | ICD-10-CM | POA: Diagnosis not present

## 2021-10-10 ENCOUNTER — Other Ambulatory Visit (HOSPITAL_COMMUNITY): Payer: Self-pay

## 2021-10-10 MED FILL — Sertraline HCl Tab 25 MG: ORAL | 90 days supply | Qty: 90 | Fill #1 | Status: AC

## 2021-10-16 DIAGNOSIS — F419 Anxiety disorder, unspecified: Secondary | ICD-10-CM | POA: Diagnosis not present

## 2021-10-26 ENCOUNTER — Inpatient Hospital Stay (HOSPITAL_COMMUNITY)
Admission: EM | Admit: 2021-10-26 | Discharge: 2021-10-30 | DRG: 522 | Disposition: A | Discharge status: Home Health | Payer: 59 | Attending: Internal Medicine | Admitting: Internal Medicine

## 2021-10-26 ENCOUNTER — Ambulatory Visit (INDEPENDENT_AMBULATORY_CARE_PROVIDER_SITE_OTHER): Payer: 59

## 2021-10-26 ENCOUNTER — Emergency Department (HOSPITAL_COMMUNITY): Payer: 59

## 2021-10-26 ENCOUNTER — Encounter (HOSPITAL_COMMUNITY): Payer: Self-pay | Admitting: Family Medicine

## 2021-10-26 DIAGNOSIS — Z01818 Encounter for other preprocedural examination: Secondary | ICD-10-CM | POA: Diagnosis not present

## 2021-10-26 DIAGNOSIS — Z79899 Other long term (current) drug therapy: Secondary | ICD-10-CM | POA: Diagnosis not present

## 2021-10-26 DIAGNOSIS — T4145XA Adverse effect of unspecified anesthetic, initial encounter: Secondary | ICD-10-CM | POA: Diagnosis not present

## 2021-10-26 DIAGNOSIS — Z20822 Contact with and (suspected) exposure to covid-19: Secondary | ICD-10-CM | POA: Diagnosis not present

## 2021-10-26 DIAGNOSIS — Z7982 Long term (current) use of aspirin: Secondary | ICD-10-CM | POA: Diagnosis not present

## 2021-10-26 DIAGNOSIS — D62 Acute posthemorrhagic anemia: Secondary | ICD-10-CM | POA: Diagnosis not present

## 2021-10-26 DIAGNOSIS — R339 Retention of urine, unspecified: Secondary | ICD-10-CM | POA: Diagnosis not present

## 2021-10-26 DIAGNOSIS — E871 Hypo-osmolality and hyponatremia: Secondary | ICD-10-CM | POA: Diagnosis not present

## 2021-10-26 DIAGNOSIS — F32A Depression, unspecified: Secondary | ICD-10-CM | POA: Diagnosis not present

## 2021-10-26 DIAGNOSIS — R531 Weakness: Secondary | ICD-10-CM | POA: Diagnosis not present

## 2021-10-26 DIAGNOSIS — R509 Fever, unspecified: Secondary | ICD-10-CM | POA: Diagnosis not present

## 2021-10-26 DIAGNOSIS — F419 Anxiety disorder, unspecified: Secondary | ICD-10-CM | POA: Diagnosis present

## 2021-10-26 DIAGNOSIS — F418 Other specified anxiety disorders: Secondary | ICD-10-CM | POA: Diagnosis not present

## 2021-10-26 DIAGNOSIS — I951 Orthostatic hypotension: Secondary | ICD-10-CM | POA: Diagnosis not present

## 2021-10-26 DIAGNOSIS — Z471 Aftercare following joint replacement surgery: Secondary | ICD-10-CM | POA: Diagnosis not present

## 2021-10-26 DIAGNOSIS — D696 Thrombocytopenia, unspecified: Secondary | ICD-10-CM | POA: Diagnosis not present

## 2021-10-26 DIAGNOSIS — R5082 Postprocedural fever: Secondary | ICD-10-CM | POA: Diagnosis not present

## 2021-10-26 DIAGNOSIS — Z96642 Presence of left artificial hip joint: Secondary | ICD-10-CM | POA: Diagnosis present

## 2021-10-26 DIAGNOSIS — E861 Hypovolemia: Secondary | ICD-10-CM | POA: Diagnosis not present

## 2021-10-26 DIAGNOSIS — S72012A Unspecified intracapsular fracture of left femur, initial encounter for closed fracture: Secondary | ICD-10-CM | POA: Diagnosis not present

## 2021-10-26 DIAGNOSIS — Z9852 Vasectomy status: Secondary | ICD-10-CM | POA: Diagnosis not present

## 2021-10-26 DIAGNOSIS — Z888 Allergy status to other drugs, medicaments and biological substances status: Secondary | ICD-10-CM

## 2021-10-26 DIAGNOSIS — S72009A Fracture of unspecified part of neck of unspecified femur, initial encounter for closed fracture: Secondary | ICD-10-CM | POA: Diagnosis present

## 2021-10-26 DIAGNOSIS — S86819A Strain of other muscle(s) and tendon(s) at lower leg level, unspecified leg, initial encounter: Secondary | ICD-10-CM | POA: Diagnosis not present

## 2021-10-26 DIAGNOSIS — L501 Idiopathic urticaria: Secondary | ICD-10-CM | POA: Diagnosis not present

## 2021-10-26 DIAGNOSIS — W19XXXA Unspecified fall, initial encounter: Secondary | ICD-10-CM

## 2021-10-26 DIAGNOSIS — R102 Pelvic and perineal pain: Secondary | ICD-10-CM | POA: Diagnosis not present

## 2021-10-26 DIAGNOSIS — S72012D Unspecified intracapsular fracture of left femur, subsequent encounter for closed fracture with routine healing: Secondary | ICD-10-CM | POA: Diagnosis not present

## 2021-10-26 DIAGNOSIS — Z886 Allergy status to analgesic agent status: Secondary | ICD-10-CM | POA: Diagnosis not present

## 2021-10-26 DIAGNOSIS — S60417A Abrasion of left little finger, initial encounter: Secondary | ICD-10-CM | POA: Diagnosis present

## 2021-10-26 DIAGNOSIS — S72002A Fracture of unspecified part of neck of left femur, initial encounter for closed fracture: Secondary | ICD-10-CM | POA: Diagnosis not present

## 2021-10-26 DIAGNOSIS — R42 Dizziness and giddiness: Secondary | ICD-10-CM | POA: Diagnosis not present

## 2021-10-26 DIAGNOSIS — J309 Allergic rhinitis, unspecified: Secondary | ICD-10-CM

## 2021-10-26 DIAGNOSIS — R079 Chest pain, unspecified: Secondary | ICD-10-CM | POA: Diagnosis not present

## 2021-10-26 DIAGNOSIS — S40212A Abrasion of left shoulder, initial encounter: Secondary | ICD-10-CM | POA: Diagnosis present

## 2021-10-26 DIAGNOSIS — M25552 Pain in left hip: Secondary | ICD-10-CM | POA: Diagnosis not present

## 2021-10-26 DIAGNOSIS — M12552 Traumatic arthropathy, left hip: Secondary | ICD-10-CM | POA: Diagnosis not present

## 2021-10-26 DIAGNOSIS — Z419 Encounter for procedure for purposes other than remedying health state, unspecified: Secondary | ICD-10-CM

## 2021-10-26 LAB — CBC WITH DIFFERENTIAL/PLATELET
Abs Immature Granulocytes: 0.04 10*3/uL (ref 0.00–0.07)
Basophils Absolute: 0 10*3/uL (ref 0.0–0.1)
Basophils Relative: 0 %
Eosinophils Absolute: 0.2 10*3/uL (ref 0.0–0.5)
Eosinophils Relative: 3 %
HCT: 46.7 % (ref 39.0–52.0)
Hemoglobin: 16.2 g/dL (ref 13.0–17.0)
Immature Granulocytes: 1 %
Lymphocytes Relative: 27 %
Lymphs Abs: 2 10*3/uL (ref 0.7–4.0)
MCH: 30.1 pg (ref 26.0–34.0)
MCHC: 34.7 g/dL (ref 30.0–36.0)
MCV: 86.8 fL (ref 80.0–100.0)
Monocytes Absolute: 0.6 10*3/uL (ref 0.1–1.0)
Monocytes Relative: 8 %
Neutro Abs: 4.5 10*3/uL (ref 1.7–7.7)
Neutrophils Relative %: 61 %
Platelets: 173 10*3/uL (ref 150–400)
RBC: 5.38 MIL/uL (ref 4.22–5.81)
RDW: 12 % (ref 11.5–15.5)
WBC: 7.3 10*3/uL (ref 4.0–10.5)
nRBC: 0 % (ref 0.0–0.2)

## 2021-10-26 LAB — COMPREHENSIVE METABOLIC PANEL
ALT: 19 U/L (ref 0–44)
AST: 27 U/L (ref 15–41)
Albumin: 4.9 g/dL (ref 3.5–5.0)
Alkaline Phosphatase: 53 U/L (ref 38–126)
Anion gap: 10 (ref 5–15)
BUN: 31 mg/dL — ABNORMAL HIGH (ref 6–20)
CO2: 26 mmol/L (ref 22–32)
Calcium: 9.6 mg/dL (ref 8.9–10.3)
Chloride: 105 mmol/L (ref 98–111)
Creatinine, Ser: 1.01 mg/dL (ref 0.61–1.24)
GFR, Estimated: >60 mL/min (ref >60)
Glucose, Bld: 117 mg/dL — ABNORMAL HIGH (ref 70–99)
Potassium: 3.9 mmol/L (ref 3.5–5.1)
Sodium: 141 mmol/L (ref 135–145)
Total Bilirubin: 0.9 mg/dL (ref 0.3–1.2)
Total Protein: 7.7 g/dL (ref 6.5–8.1)

## 2021-10-26 LAB — TYPE AND SCREEN
ABO/RH(D): A POS
Antibody Screen: NEGATIVE

## 2021-10-26 LAB — RESP PANEL BY RT-PCR (FLU A&B, COVID) ARPGX2
Influenza A by PCR: NEGATIVE
Influenza B by PCR: NEGATIVE
SARS Coronavirus 2 by RT PCR: NEGATIVE

## 2021-10-26 LAB — PROTIME-INR
INR: 1.1 (ref 0.8–1.2)
Prothrombin Time: 13.9 seconds (ref 11.4–15.2)

## 2021-10-26 MED ORDER — HYDROMORPHONE HCL 1 MG/ML IJ SOLN
1.0000 mg | Freq: Once | INTRAMUSCULAR | Status: AC
  Administered 2021-10-26: 1 mg via INTRAVENOUS

## 2021-10-26 MED ORDER — ONDANSETRON HCL 4 MG/2ML IJ SOLN
4.0000 mg | Freq: Once | INTRAMUSCULAR | Status: AC
  Administered 2021-10-26: 4 mg via INTRAVENOUS

## 2021-10-26 MED ORDER — SODIUM CHLORIDE 0.9 % IV BOLUS
1000.0000 mL | Freq: Once | INTRAVENOUS | Status: AC
  Administered 2021-10-26: 1000 mL via INTRAVENOUS

## 2021-10-26 MED ORDER — METHOCARBAMOL 1000 MG/10ML IJ SOLN
500.0000 mg | Freq: Four times a day (QID) | INTRAVENOUS | Status: DC | PRN
  Administered 2021-10-27: 500 mg via INTRAVENOUS
  Filled 2021-10-26: qty 500

## 2021-10-26 MED ORDER — SENNA 8.6 MG PO TABS
1.0000 | ORAL_TABLET | Freq: Two times a day (BID) | ORAL | Status: DC
  Administered 2021-10-27 – 2021-10-30 (×2): 8.6 mg via ORAL
  Filled 2021-10-26 (×6): qty 1

## 2021-10-26 MED ORDER — CHLORHEXIDINE GLUCONATE 4 % EX LIQD: 60.0000 mL | Freq: Once | CUTANEOUS | Status: DC

## 2021-10-26 MED ORDER — OMEGA-3-ACID ETHYL ESTERS 1 G PO CAPS
1.0000 g | ORAL_CAPSULE | Freq: Every day | ORAL | Status: DC
  Administered 2021-10-28 – 2021-10-30 (×3): 1 g via ORAL
  Filled 2021-10-26 (×3): qty 1

## 2021-10-26 MED ORDER — POVIDONE-IODINE 10 % EX SWAB: 2.0000 "application " | Freq: Once | CUTANEOUS | Status: DC

## 2021-10-26 MED ORDER — LEVOCETIRIZINE DIHYDROCHLORIDE 5 MG PO TABS: 5.0000 mg | ORAL_TABLET | Freq: Every day | ORAL | Status: DC

## 2021-10-26 MED ORDER — HYDROCODONE-ACETAMINOPHEN 5-325 MG PO TABS
1.0000 | ORAL_TABLET | Freq: Four times a day (QID) | ORAL | Status: DC | PRN
  Administered 2021-10-26 – 2021-10-28 (×2): 2 via ORAL
  Filled 2021-10-26: qty 1
  Filled 2021-10-26 (×2): qty 2

## 2021-10-26 MED ORDER — HYDROMORPHONE HCL 1 MG/ML IJ SOLN
1.0000 mg | Freq: Once | INTRAMUSCULAR | Status: AC
  Administered 2021-10-26: 1 mg via INTRAVENOUS
  Filled 2021-10-26 (×2): qty 1

## 2021-10-26 MED ORDER — CEFAZOLIN SODIUM-DEXTROSE 2-4 GM/100ML-% IV SOLN: 2.0000 g | INTRAVENOUS | Status: DC

## 2021-10-26 MED ORDER — LORATADINE 10 MG PO TABS
10.0000 mg | ORAL_TABLET | Freq: Every day | ORAL | Status: DC
  Administered 2021-10-27 – 2021-10-30 (×4): 10 mg via ORAL
  Filled 2021-10-26 (×4): qty 1

## 2021-10-26 MED ORDER — FLUTICASONE PROPIONATE 50 MCG/ACT NA SUSP
1.0000 | Freq: Every day | NASAL | Status: DC
  Administered 2021-10-27 – 2021-10-30 (×4): 1 via NASAL
  Filled 2021-10-26 (×2): qty 16

## 2021-10-26 MED ORDER — MORPHINE SULFATE (PF) 2 MG/ML IV SOLN
1.0000 mg | INTRAVENOUS | Status: DC | PRN
  Administered 2021-10-26 – 2021-10-27 (×4): 1 mg via INTRAVENOUS
  Filled 2021-10-26 (×4): qty 1

## 2021-10-26 MED ORDER — TRANEXAMIC ACID-NACL 1000-0.7 MG/100ML-% IV SOLN: 1000.0000 mg | INTRAVENOUS | Status: DC

## 2021-10-26 MED ORDER — ONDANSETRON HCL 4 MG/2ML IJ SOLN: 4.0000 mg | Freq: Four times a day (QID) | INTRAMUSCULAR | Status: DC | PRN

## 2021-10-26 MED ORDER — SERTRALINE HCL 25 MG PO TABS
25.0000 mg | ORAL_TABLET | Freq: Every day | ORAL | Status: DC
  Administered 2021-10-27 – 2021-10-30 (×4): 25 mg via ORAL
  Filled 2021-10-26 (×4): qty 1

## 2021-10-26 NOTE — Plan of Care (Signed)
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
## 2021-10-27 ENCOUNTER — Inpatient Hospital Stay (HOSPITAL_COMMUNITY): Payer: 59 | Admitting: Anesthesiology

## 2021-10-27 ENCOUNTER — Other Ambulatory Visit: Payer: Self-pay

## 2021-10-27 ENCOUNTER — Inpatient Hospital Stay (HOSPITAL_COMMUNITY): Payer: 59

## 2021-10-27 ENCOUNTER — Encounter (HOSPITAL_COMMUNITY): Payer: Self-pay | Admitting: Family Medicine

## 2021-10-27 ENCOUNTER — Encounter (HOSPITAL_COMMUNITY)
Admission: EM | Disposition: A | Discharge status: Home Health | Payer: Self-pay | Source: Home / Self Care | Attending: Internal Medicine

## 2021-10-27 DIAGNOSIS — M12552 Traumatic arthropathy, left hip: Secondary | ICD-10-CM

## 2021-10-27 DIAGNOSIS — S72002A Fracture of unspecified part of neck of left femur, initial encounter for closed fracture: Secondary | ICD-10-CM | POA: Diagnosis not present

## 2021-10-27 DIAGNOSIS — F32A Depression, unspecified: Secondary | ICD-10-CM | POA: Diagnosis not present

## 2021-10-27 HISTORY — PX: TOTAL HIP ARTHROPLASTY: SHX124

## 2021-10-27 LAB — URINALYSIS, ROUTINE W REFLEX MICROSCOPIC
Bilirubin Urine: NEGATIVE
Glucose, UA: NEGATIVE mg/dL
Hgb urine dipstick: NEGATIVE
Ketones, ur: 5 mg/dL — AB
Leukocytes,Ua: NEGATIVE
Nitrite: NEGATIVE
Protein, ur: NEGATIVE mg/dL
Specific Gravity, Urine: 1.023 (ref 1.005–1.030)
pH: 5 (ref 5.0–8.0)

## 2021-10-27 LAB — ABO/RH: ABO/RH(D): A POS

## 2021-10-27 SURGERY — ARTHROPLASTY, HIP, TOTAL, ANTERIOR APPROACH
Anesthesia: General | Site: Hip | Laterality: Left

## 2021-10-27 SURGERY — ARTHROPLASTY, HIP, TOTAL, ANTERIOR APPROACH
Anesthesia: Choice | Site: Hip | Laterality: Left

## 2021-10-27 MED ORDER — ACETAMINOPHEN 325 MG PO TABS: 325.0000 mg | ORAL_TABLET | Freq: Four times a day (QID) | ORAL | Status: DC | PRN

## 2021-10-27 MED ORDER — DIPHENHYDRAMINE HCL 12.5 MG/5ML PO ELIX: 12.5000 mg | ORAL_SOLUTION | ORAL | Status: DC | PRN

## 2021-10-27 MED ORDER — ONDANSETRON HCL 4 MG/2ML IJ SOLN: 4.0000 mg | Freq: Four times a day (QID) | INTRAMUSCULAR | Status: DC | PRN

## 2021-10-27 MED ORDER — LACTATED RINGERS IV SOLN: INTRAVENOUS | Status: DC | PRN

## 2021-10-27 MED ORDER — METOCLOPRAMIDE HCL 5 MG PO TABS
5.0000 mg | ORAL_TABLET | Freq: Three times a day (TID) | ORAL | Status: DC | PRN
  Filled 2021-10-27: qty 2

## 2021-10-27 MED ORDER — ONDANSETRON HCL 4 MG/2ML IJ SOLN
INTRAMUSCULAR | Status: AC
  Filled 2021-10-27: qty 2

## 2021-10-27 MED ORDER — HYDROMORPHONE HCL 1 MG/ML IJ SOLN: 0.2500 mg | INTRAMUSCULAR | Status: DC | PRN

## 2021-10-27 MED ORDER — DEXAMETHASONE SODIUM PHOSPHATE 10 MG/ML IJ SOLN
INTRAMUSCULAR | Status: DC | PRN
  Administered 2021-10-27: 5 mg via INTRAVENOUS

## 2021-10-27 MED ORDER — CEFAZOLIN SODIUM-DEXTROSE 2-4 GM/100ML-% IV SOLN: 2.0000 g | Freq: Once | INTRAVENOUS | Status: DC

## 2021-10-27 MED ORDER — TRANEXAMIC ACID-NACL 1000-0.7 MG/100ML-% IV SOLN
1000.0000 mg | INTRAVENOUS | Status: AC
  Administered 2021-10-27: 1000 mg via INTRAVENOUS

## 2021-10-27 MED ORDER — 0.9 % SODIUM CHLORIDE (POUR BTL) OPTIME
TOPICAL | Status: DC | PRN
  Administered 2021-10-27: 1000 mL

## 2021-10-27 MED ORDER — FENTANYL CITRATE (PF) 100 MCG/2ML IJ SOLN
INTRAMUSCULAR | Status: DC | PRN
  Administered 2021-10-27 (×3): 50 ug via INTRAVENOUS
  Administered 2021-10-27: 100 ug via INTRAVENOUS

## 2021-10-27 MED ORDER — PROPOFOL 10 MG/ML IV BOLUS
INTRAVENOUS | Status: AC
  Filled 2021-10-27: qty 20

## 2021-10-27 MED ORDER — CEFAZOLIN SODIUM-DEXTROSE 1-4 GM/50ML-% IV SOLN
1.0000 g | Freq: Four times a day (QID) | INTRAVENOUS | Status: AC
  Administered 2021-10-27 (×2): 1 g via INTRAVENOUS
  Filled 2021-10-27 (×2): qty 50

## 2021-10-27 MED ORDER — ONDANSETRON HCL 4 MG PO TABS
4.0000 mg | ORAL_TABLET | Freq: Four times a day (QID) | ORAL | Status: DC | PRN
  Filled 2021-10-27: qty 1

## 2021-10-27 MED ORDER — STERILE WATER FOR IRRIGATION IR SOLN
Status: DC | PRN
  Administered 2021-10-27: 2000 mL

## 2021-10-27 MED ORDER — ASPIRIN 81 MG PO CHEW
81.0000 mg | CHEWABLE_TABLET | Freq: Two times a day (BID) | ORAL | Status: DC
  Administered 2021-10-27 – 2021-10-30 (×6): 81 mg via ORAL
  Filled 2021-10-27 (×6): qty 1

## 2021-10-27 MED ORDER — CEFAZOLIN SODIUM-DEXTROSE 2-3 GM-%(50ML) IV SOLR
INTRAVENOUS | Status: DC | PRN
  Administered 2021-10-27: 2 g via INTRAVENOUS

## 2021-10-27 MED ORDER — MIDAZOLAM HCL 5 MG/5ML IJ SOLN
INTRAMUSCULAR | Status: DC | PRN
  Administered 2021-10-27: 2 mg via INTRAVENOUS

## 2021-10-27 MED ORDER — ONDANSETRON HCL 4 MG/2ML IJ SOLN
INTRAMUSCULAR | Status: DC | PRN
  Administered 2021-10-27: 4 mg via INTRAVENOUS

## 2021-10-27 MED ORDER — METOCLOPRAMIDE HCL 5 MG/ML IJ SOLN: 5.0000 mg | Freq: Three times a day (TID) | INTRAMUSCULAR | Status: DC | PRN

## 2021-10-27 MED ORDER — FENTANYL CITRATE (PF) 250 MCG/5ML IJ SOLN
INTRAMUSCULAR | Status: AC
  Filled 2021-10-27: qty 5

## 2021-10-27 MED ORDER — SUGAMMADEX SODIUM 200 MG/2ML IV SOLN
INTRAVENOUS | Status: DC | PRN
  Administered 2021-10-27: 200 mg via INTRAVENOUS

## 2021-10-27 MED ORDER — ROCURONIUM BROMIDE 10 MG/ML (PF) SYRINGE
PREFILLED_SYRINGE | INTRAVENOUS | Status: DC | PRN
  Administered 2021-10-27: 60 mg via INTRAVENOUS
  Administered 2021-10-27: 10 mg via INTRAVENOUS

## 2021-10-27 MED ORDER — HYDROMORPHONE HCL 1 MG/ML IJ SOLN: 0.5000 mg | INTRAMUSCULAR | Status: DC | PRN

## 2021-10-27 MED ORDER — MIDAZOLAM HCL 2 MG/2ML IJ SOLN
INTRAMUSCULAR | Status: AC
  Filled 2021-10-27: qty 2

## 2021-10-27 MED ORDER — METHOCARBAMOL 500 MG IVPB - SIMPLE MED
500.0000 mg | Freq: Four times a day (QID) | INTRAVENOUS | Status: DC | PRN
  Filled 2021-10-27: qty 50

## 2021-10-27 MED ORDER — SODIUM CHLORIDE 0.9 % IV SOLN: INTRAVENOUS | Status: DC

## 2021-10-27 MED ORDER — OXYCODONE HCL 5 MG PO TABS
10.0000 mg | ORAL_TABLET | ORAL | Status: DC | PRN
  Administered 2021-10-27: 10 mg via ORAL

## 2021-10-27 MED ORDER — PHENOL 1.4 % MT LIQD: 1.0000 | OROMUCOSAL | Status: DC | PRN

## 2021-10-27 MED ORDER — METHOCARBAMOL 500 MG PO TABS
500.0000 mg | ORAL_TABLET | Freq: Four times a day (QID) | ORAL | Status: DC | PRN
  Administered 2021-10-27 – 2021-10-30 (×5): 500 mg via ORAL
  Filled 2021-10-27 (×6): qty 1

## 2021-10-27 MED ORDER — OXYCODONE HCL 5 MG PO TABS
5.0000 mg | ORAL_TABLET | ORAL | Status: DC | PRN
  Administered 2021-10-27 (×2): 5 mg via ORAL
  Administered 2021-10-28: 10 mg via ORAL
  Administered 2021-10-28: 5 mg via ORAL
  Filled 2021-10-27: qty 2
  Filled 2021-10-27 (×2): qty 1
  Filled 2021-10-27 (×2): qty 2

## 2021-10-27 MED ORDER — ALUM & MAG HYDROXIDE-SIMETH 200-200-20 MG/5ML PO SUSP: 30.0000 mL | ORAL | Status: DC | PRN

## 2021-10-27 MED ORDER — PROPOFOL 10 MG/ML IV BOLUS
INTRAVENOUS | Status: DC | PRN
  Administered 2021-10-27: 140 mg via INTRAVENOUS

## 2021-10-27 MED ORDER — PANTOPRAZOLE SODIUM 40 MG PO TBEC
40.0000 mg | DELAYED_RELEASE_TABLET | Freq: Every day | ORAL | Status: DC
  Administered 2021-10-30: 40 mg via ORAL
  Filled 2021-10-27: qty 1

## 2021-10-27 MED ORDER — TRANEXAMIC ACID-NACL 1000-0.7 MG/100ML-% IV SOLN
INTRAVENOUS | Status: AC
  Filled 2021-10-27: qty 100

## 2021-10-27 MED ORDER — CEFAZOLIN SODIUM-DEXTROSE 2-4 GM/100ML-% IV SOLN
INTRAVENOUS | Status: AC
  Filled 2021-10-27: qty 100

## 2021-10-27 MED ORDER — ACETAMINOPHEN 10 MG/ML IV SOLN
INTRAVENOUS | Status: AC
  Administered 2021-10-27: 1000 mg
  Filled 2021-10-27: qty 100

## 2021-10-27 MED ORDER — MENTHOL 3 MG MT LOZG: 1.0000 | LOZENGE | OROMUCOSAL | Status: DC | PRN

## 2021-10-27 MED ORDER — LIDOCAINE 2% (20 MG/ML) 5 ML SYRINGE
INTRAMUSCULAR | Status: DC | PRN
  Administered 2021-10-27: 80 mg via INTRAVENOUS

## 2021-10-27 MED ORDER — DOCUSATE SODIUM 100 MG PO CAPS
100.0000 mg | ORAL_CAPSULE | Freq: Two times a day (BID) | ORAL | Status: DC
  Administered 2021-10-28 – 2021-10-30 (×3): 100 mg via ORAL
  Filled 2021-10-27 (×7): qty 1

## 2021-10-27 MED ORDER — SIMETHICONE 80 MG PO CHEW: 80.0000 mg | CHEWABLE_TABLET | Freq: Four times a day (QID) | ORAL | Status: DC | PRN

## 2021-10-27 SURGICAL SUPPLY — 43 items
APL SKNCLS STERI-STRIP NONHPOA (GAUZE/BANDAGES/DRESSINGS)
BAG COUNTER SPONGE SURGICOUNT (BAG) ×2 IMPLANT
BAG SPEC THK2 15X12 ZIP CLS (MISCELLANEOUS)
BAG SPNG CNTER NS LX DISP (BAG) ×1
BAG ZIPLOCK 12X15 (MISCELLANEOUS) IMPLANT
BENZOIN TINCTURE PRP APPL 2/3 (GAUZE/BANDAGES/DRESSINGS) IMPLANT
BLADE SAW SGTL 18X1.27X75 (BLADE) ×2 IMPLANT
COVER PERINEAL POST (MISCELLANEOUS) ×2 IMPLANT
COVER SURGICAL LIGHT HANDLE (MISCELLANEOUS) ×2 IMPLANT
DRAPE FOOT SWITCH (DRAPES) ×2 IMPLANT
DRAPE STERI IOBAN 125X83 (DRAPES) ×2 IMPLANT
DRAPE U-SHAPE 47X51 STRL (DRAPES) ×4 IMPLANT
DRESSING AQUACEL AG SP 3.5X10 (GAUZE/BANDAGES/DRESSINGS) IMPLANT
DRSG AQUACEL AG ADV 3.5X10 (GAUZE/BANDAGES/DRESSINGS) ×2 IMPLANT
DRSG AQUACEL AG SP 3.5X10 (GAUZE/BANDAGES/DRESSINGS) ×2
DURAPREP 26ML APPLICATOR (WOUND CARE) ×2 IMPLANT
ELECT REM PT RETURN 15FT ADLT (MISCELLANEOUS) ×2 IMPLANT
GAUZE XEROFORM 1X8 LF (GAUZE/BANDAGES/DRESSINGS) ×2 IMPLANT
GLOVE SRG 8 PF TXTR STRL LF DI (GLOVE) ×2 IMPLANT
GLOVE SURG ENC MOIS LTX SZ7.5 (GLOVE) ×2 IMPLANT
GLOVE SURG NEOPR MICRO LF SZ8 (GLOVE) ×2 IMPLANT
GLOVE SURG UNDER POLY LF SZ8 (GLOVE) ×4
GOWN STRL REUS W/TWL XL LVL3 (GOWN DISPOSABLE) ×4 IMPLANT
HANDPIECE INTERPULSE COAX TIP (DISPOSABLE) ×2
HEAD CERAMIC DELTA 36 PLUS 1.5 (Hips) ×1 IMPLANT
HOLDER FOLEY CATH W/STRAP (MISCELLANEOUS) ×2 IMPLANT
KIT TURNOVER KIT A (KITS) IMPLANT
LINER ACETAB NEUTRAL 36ID 520D (Liner) ×1 IMPLANT
PACK ANTERIOR HIP CUSTOM (KITS) ×2 IMPLANT
PENCIL SMOKE EVACUATOR (MISCELLANEOUS) ×1 IMPLANT
PIN SECTOR W/GRIP ACE CUP 52MM (Hips) ×1 IMPLANT
SET HNDPC FAN SPRY TIP SCT (DISPOSABLE) ×1 IMPLANT
STAPLER VISISTAT 35W (STAPLE) ×1 IMPLANT
STEM FEMORAL SZ5 HIGH ACTIS (Stem) ×1 IMPLANT
STRIP CLOSURE SKIN 1/2X4 (GAUZE/BANDAGES/DRESSINGS) IMPLANT
SUT ETHIBOND NAB CT1 #1 30IN (SUTURE) ×2 IMPLANT
SUT ETHILON 2 0 PS N (SUTURE) IMPLANT
SUT MNCRL AB 4-0 PS2 18 (SUTURE) IMPLANT
SUT VIC AB 0 CT1 36 (SUTURE) ×2 IMPLANT
SUT VIC AB 1 CT1 36 (SUTURE) ×2 IMPLANT
SUT VIC AB 2-0 CT1 27 (SUTURE) ×4
SUT VIC AB 2-0 CT1 TAPERPNT 27 (SUTURE) ×2 IMPLANT
TRAY FOLEY MTR SLVR 16FR STAT (SET/KITS/TRAYS/PACK) IMPLANT

## 2021-10-27 NOTE — Progress Notes (Signed)
Patient unable to void and has belly discomfort. Bladder scanned for 433 ml.  Call out to covering MD. Kennon Holter NP with order to  in/out cath one time. In/out cath completed per MD order  and unit protocol.
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
## 2021-10-27 NOTE — Anesthesia Procedure Notes (Signed)
Procedure Name: Intubation Date/Time: 10/27/2021 11:27 AM Performed by: Gean Maidens, CRNA Pre-anesthesia Checklist: Patient identified, Emergency Drugs available, Suction available, Patient being monitored and Timeout performed Patient Re-evaluated:Patient Re-evaluated prior to induction Oxygen Delivery Method: Circle system utilized Preoxygenation: Pre-oxygenation with 100% oxygen Induction Type: IV induction Ventilation: Mask ventilation without difficulty Laryngoscope Size: Mac and 4 Grade View: Grade I Tube type: Oral Tube size: 7.5 mm Number of attempts: 1 Airway Equipment and Method: Stylet Placement Confirmation: ETT inserted through vocal cords under direct vision, positive ETCO2 and breath sounds checked- equal and bilateral Secured at: 23 cm Tube secured with: Tape Dental Injury: Teeth and Oropharynx as per pre-operative assessment

## 2021-10-27 NOTE — Brief Op Note (Signed)
10/26/2021 - 10/27/2021  12:21 PM  PATIENT:  Randy Meyer  55 y.o. male  PRE-OPERATIVE DIAGNOSIS:  DISPLACED LEFT FEMORAL NECK FRACTURE  POST-OPERATIVE DIAGNOSIS:  DISPLACED LEFT FEMORAL NECK FRACTURE  PROCEDURE:  Procedure(s): TOTAL HIP ARTHROPLASTY ANTERIOR APPROACH (Left)  SURGEON:  Surgeon(s) and Role:    * Mcarthur Rossetti, MD - Primary  PHYSICIAN ASSISTANT:  Benita Stabile, PA-C  ANESTHESIA:   general  EBL:  500 mL   COUNTS:  YES  DICTATION: .Other Dictation: Dictation Number [id number]  PATIENT DISPOSITION:  PACU - hemodynamically stable.   Delay start of Pharmacological VTE agent (>24hrs) due to surgical blood loss or risk of bleeding: no

## 2021-10-27 NOTE — Transfer of Care (Signed)
Immediate Anesthesia Transfer of Care Note  Patient: Randy Meyer  Procedure(s) Performed: TOTAL HIP ARTHROPLASTY ANTERIOR APPROACH (Left: Hip)  Patient Location: PACU  Anesthesia Type:General  Level of Consciousness: awake, alert  and oriented  Airway & Oxygen Therapy: Patient Spontanous Breathing and Patient connected to face mask oxygen  Post-op Assessment: Report given to RN and Post -op Vital signs reviewed and stable  Post vital signs: Reviewed and stable  Last Vitals:  Vitals Value Taken Time  BP 111/69 10/27/21 1251  Temp    Pulse 81 10/27/21 1253  Resp 10 10/27/21 1253  SpO2 100 % 10/27/21 1253  Vitals shown include unvalidated device data.  Last Pain:  Vitals:   10/27/21 0800  TempSrc:   PainSc: 2       Patients Stated Pain Goal: 2 (45/99/77 4142)  Complications: No notable events documented.

## 2021-10-27 NOTE — Anesthesia Preprocedure Evaluation (Addendum)
Anesthesia Evaluation  Patient identified by MRN, date of birth, ID band Patient awake    Reviewed: Allergy & Precautions, H&P , NPO status , Patient's Chart, lab work & pertinent test results  Airway Mallampati: II  TM Distance: >3 FB Neck ROM: Full    Dental no notable dental hx. (+) Teeth Intact, Dental Advisory Given   Pulmonary neg pulmonary ROS,    Pulmonary exam normal breath sounds clear to auscultation       Cardiovascular negative cardio ROS   Rhythm:Regular Rate:Normal     Neuro/Psych Anxiety Depression negative neurological ROS     GI/Hepatic negative GI ROS, Neg liver ROS,   Endo/Other  negative endocrine ROS  Renal/GU negative Renal ROS  negative genitourinary   Musculoskeletal   Abdominal   Peds  Hematology negative hematology ROS (+)   Anesthesia Other Findings   Reproductive/Obstetrics negative OB ROS                            Anesthesia Physical Anesthesia Plan  ASA: 2  Anesthesia Plan: General   Post-op Pain Management:    Induction: Intravenous  PONV Risk Score and Plan: 3 and Ondansetron, Dexamethasone and Midazolam  Airway Management Planned: Oral ETT  Additional Equipment:   Intra-op Plan:   Post-operative Plan: Extubation in OR  Informed Consent: I have reviewed the patients History and Physical, chart, labs and discussed the procedure including the risks, benefits and alternatives for the proposed anesthesia with the patient or authorized representative who has indicated his/her understanding and acceptance.     Dental advisory given  Plan Discussed with: CRNA  Anesthesia Plan Comments:         Anesthesia Quick Evaluation

## 2021-10-27 NOTE — Anesthesia Postprocedure Evaluation (Signed)
Anesthesia Post Note  Patient: Randy Meyer  Procedure(s) Performed: TOTAL HIP ARTHROPLASTY ANTERIOR APPROACH (Left: Hip)     Patient location during evaluation: PACU Anesthesia Type: General Level of consciousness: awake and alert Pain management: pain level controlled Vital Signs Assessment: post-procedure vital signs reviewed and stable Respiratory status: spontaneous breathing, nonlabored ventilation, respiratory function stable and patient connected to nasal cannula oxygen Cardiovascular status: blood pressure returned to baseline and stable Postop Assessment: no apparent nausea or vomiting Anesthetic complications: no   No notable events documented.  Last Vitals:  Vitals:   10/27/21 1345 10/27/21 1400  BP: 104/66 106/68  Pulse: 67 65  Resp: 16 12  Temp: 37.5 C   SpO2: 100% 99%    Last Pain:  Vitals:   10/27/21 1400  TempSrc:   PainSc: Asleep                 Hetal Proano,W. EDMOND
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
## 2021-10-28 ENCOUNTER — Inpatient Hospital Stay (HOSPITAL_COMMUNITY): Payer: 59

## 2021-10-28 DIAGNOSIS — S72002A Fracture of unspecified part of neck of left femur, initial encounter for closed fracture: Secondary | ICD-10-CM | POA: Diagnosis not present

## 2021-10-28 DIAGNOSIS — F32A Depression, unspecified: Secondary | ICD-10-CM | POA: Diagnosis not present

## 2021-10-28 LAB — CBC WITH DIFFERENTIAL/PLATELET
Abs Immature Granulocytes: 0.03 10*3/uL (ref 0.00–0.07)
Basophils Absolute: 0 10*3/uL (ref 0.0–0.1)
Basophils Relative: 0 %
Eosinophils Absolute: 0.3 10*3/uL (ref 0.0–0.5)
Eosinophils Relative: 3 %
HCT: 31.5 % — ABNORMAL LOW (ref 39.0–52.0)
Hemoglobin: 11 g/dL — ABNORMAL LOW (ref 13.0–17.0)
Immature Granulocytes: 0 %
Lymphocytes Relative: 10 %
Lymphs Abs: 0.8 10*3/uL (ref 0.7–4.0)
MCH: 31 pg (ref 26.0–34.0)
MCHC: 34.9 g/dL (ref 30.0–36.0)
MCV: 88.7 fL (ref 80.0–100.0)
Monocytes Absolute: 1 10*3/uL (ref 0.1–1.0)
Monocytes Relative: 13 %
Neutro Abs: 5.9 10*3/uL (ref 1.7–7.7)
Neutrophils Relative %: 74 %
Platelets: 114 10*3/uL — ABNORMAL LOW (ref 150–400)
RBC: 3.55 MIL/uL — ABNORMAL LOW (ref 4.22–5.81)
RDW: 12.1 % (ref 11.5–15.5)
WBC: 8 10*3/uL (ref 4.0–10.5)
nRBC: 0 % (ref 0.0–0.2)

## 2021-10-28 LAB — CBC
HCT: 33.2 % — ABNORMAL LOW (ref 39.0–52.0)
Hemoglobin: 11.5 g/dL — ABNORMAL LOW (ref 13.0–17.0)
MCH: 30.8 pg (ref 26.0–34.0)
MCHC: 34.6 g/dL (ref 30.0–36.0)
MCV: 89 fL (ref 80.0–100.0)
Platelets: 120 10*3/uL — ABNORMAL LOW (ref 150–400)
RBC: 3.73 MIL/uL — ABNORMAL LOW (ref 4.22–5.81)
RDW: 12.1 % (ref 11.5–15.5)
WBC: 8.5 10*3/uL (ref 4.0–10.5)
nRBC: 0 % (ref 0.0–0.2)

## 2021-10-28 LAB — BASIC METABOLIC PANEL
Anion gap: 4 — ABNORMAL LOW (ref 5–15)
BUN: 17 mg/dL (ref 6–20)
CO2: 28 mmol/L (ref 22–32)
Calcium: 8 mg/dL — ABNORMAL LOW (ref 8.9–10.3)
Chloride: 100 mmol/L (ref 98–111)
Creatinine, Ser: 0.86 mg/dL (ref 0.61–1.24)
GFR, Estimated: >60 mL/min (ref >60)
Glucose, Bld: 110 mg/dL — ABNORMAL HIGH (ref 70–99)
Potassium: 3.5 mmol/L (ref 3.5–5.1)
Sodium: 132 mmol/L — ABNORMAL LOW (ref 135–145)

## 2021-10-28 LAB — LACTIC ACID, PLASMA: Lactic Acid, Venous: 1.2 mmol/L (ref 0.5–1.9)

## 2021-10-28 LAB — PROCALCITONIN: Procalcitonin: <0.1 ng/mL

## 2021-10-28 MED ORDER — SODIUM CHLORIDE 0.9 % IV BOLUS
1000.0000 mL | Freq: Once | INTRAVENOUS | Status: AC
  Administered 2021-10-28: 1000 mL via INTRAVENOUS

## 2021-10-28 MED ORDER — LACTATED RINGERS IV BOLUS
1000.0000 mL | Freq: Once | INTRAVENOUS | Status: AC
  Administered 2021-10-28: 1000 mL via INTRAVENOUS

## 2021-10-28 MED ORDER — POLYETHYLENE GLYCOL 3350 17 G PO PACK: 17.0000 g | PACK | Freq: Every day | ORAL | Status: DC | PRN

## 2021-10-28 NOTE — Discharge Instructions (Signed)
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
## 2021-10-28 NOTE — Hospital Course (Signed)
     Subjective: 1 Day Post-Op Procedure(s) (LRB): TOTAL HIP ARTHROPLASTY ANTERIOR APPROACH (Left) Episode of orthostatic hypotension, Hgb 16.2 preop and today 11.0 Patient reports pain as moderate.    Objective:   VITALS:  Temp:  [98.2 F (36.8 C)-100.9 F (38.3 C)] 99.2 F (37.3 C) (11/06 1032) Pulse Rate:  [65-85] 75 (11/06 1032) Resp:  [12-22] 16 (11/06 1032) BP: (68-114)/(49-72) 106/61 (11/06 1032) SpO2:  [95 %-100 %] 100 % (11/06 1032)  Neurologically intact ABD soft Neurovascular intact Sensation intact distally Intact pulses distally Dorsiflexion/Plantar flexion intact Incision: dressing C/D/I and no drainage   LABS Recent Labs    10/26/21 1833 10/28/21 0730 10/28/21 1018  HGB 16.2 11.5* 11.0*  WBC 7.3 8.5 8.0  PLT 173 120* 114*   Recent Labs    10/26/21 1833 10/28/21 0303  NA 141 132*  K 3.9 3.5  CL 105 100  CO2 26 28  BUN 31* 17  CREATININE 1.01 0.86  GLUCOSE 117* 110*   Recent Labs    10/26/21 1833  INR 1.1     Assessment/Plan: 1 Day Post-Op Procedure(s) (LRB): TOTAL HIP ARTHROPLASTY ANTERIOR APPROACH (Left) Anemia due to blood loss. Episode of orthostatic hypotension. When transitioning sitting to standing and with PT.   Advance diet Up with therapy Check orthostatic blood pressures. Basil Dess 10/28/2021, 11:37 AM
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
## 2021-10-28 NOTE — Progress Notes (Signed)
Patient ambulated in the room (BP checked sitting and standing). Systolic number sitting was 116. Systolic number after standing up and walking around the room was 108. Patient tolerated walking in the room well. No complaints of dizziness. He thinks that drinking an ensure helped him.
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
## 2021-10-29 ENCOUNTER — Other Ambulatory Visit (HOSPITAL_COMMUNITY): Payer: Self-pay

## 2021-10-29 ENCOUNTER — Inpatient Hospital Stay (HOSPITAL_COMMUNITY): Payer: 59

## 2021-10-29 ENCOUNTER — Encounter (HOSPITAL_COMMUNITY): Payer: Self-pay | Admitting: Family Medicine

## 2021-10-29 DIAGNOSIS — S72002A Fracture of unspecified part of neck of left femur, initial encounter for closed fracture: Secondary | ICD-10-CM | POA: Diagnosis not present

## 2021-10-29 DIAGNOSIS — F32A Depression, unspecified: Secondary | ICD-10-CM | POA: Diagnosis not present

## 2021-10-29 LAB — BASIC METABOLIC PANEL
Anion gap: 5 (ref 5–15)
BUN: 14 mg/dL (ref 6–20)
CO2: 28 mmol/L (ref 22–32)
Calcium: 8.2 mg/dL — ABNORMAL LOW (ref 8.9–10.3)
Chloride: 104 mmol/L (ref 98–111)
Creatinine, Ser: 0.62 mg/dL (ref 0.61–1.24)
GFR, Estimated: >60 mL/min (ref >60)
Glucose, Bld: 101 mg/dL — ABNORMAL HIGH (ref 70–99)
Potassium: 4.2 mmol/L (ref 3.5–5.1)
Sodium: 137 mmol/L (ref 135–145)

## 2021-10-29 LAB — CBC
HCT: 28.7 % — ABNORMAL LOW (ref 39.0–52.0)
Hemoglobin: 9.9 g/dL — ABNORMAL LOW (ref 13.0–17.0)
MCH: 30.6 pg (ref 26.0–34.0)
MCHC: 34.5 g/dL (ref 30.0–36.0)
MCV: 88.6 fL (ref 80.0–100.0)
Platelets: 99 10*3/uL — ABNORMAL LOW (ref 150–400)
RBC: 3.24 MIL/uL — ABNORMAL LOW (ref 4.22–5.81)
RDW: 12.1 % (ref 11.5–15.5)
WBC: 6.5 10*3/uL (ref 4.0–10.5)
nRBC: 0 % (ref 0.0–0.2)

## 2021-10-29 MED ORDER — HYDROCODONE-ACETAMINOPHEN 5-325 MG PO TABS
1.0000 | ORAL_TABLET | ORAL | Status: DC | PRN
  Administered 2021-10-29 (×3): 1 via ORAL
  Filled 2021-10-29 (×2): qty 1

## 2021-10-29 MED ORDER — ASPIRIN 81 MG PO CHEW
81.0000 mg | CHEWABLE_TABLET | Freq: Two times a day (BID) | ORAL | 0 refills | Status: DC
  Filled 2021-10-29: qty 30, 15d supply, fill #0

## 2021-10-29 MED ORDER — METHOCARBAMOL 500 MG PO TABS
500.0000 mg | ORAL_TABLET | Freq: Four times a day (QID) | ORAL | 1 refills | Status: DC | PRN
  Filled 2021-10-29: qty 30, 8d supply, fill #0

## 2021-10-29 MED ORDER — HYDROCODONE-ACETAMINOPHEN 5-325 MG PO TABS
1.0000 | ORAL_TABLET | ORAL | 0 refills | Status: DC | PRN
  Filled 2021-10-29: qty 30, 3d supply, fill #0
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
## 2021-10-29 NOTE — Progress Notes (Signed)
Patient ID: Randy Meyer, male   DOB: 09-Jul-1966, 55 y.o.   MRN: [id number] Awake and alert this morning.  Reports feeling a little better.  Vitals stable.  Was not orthostatic last evening.  Hgb 9.9.  Will try hydrocodone instead of oxycodone.  His left hip is stable.  Can possibly discharge to home this afternoon if doing well.
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
## 2021-10-29 NOTE — TOC Transition Note (Signed)
Transition of Care [facility]) - CM/SW Discharge Note   Patient Details  Name: Randy Meyer MRN: [id number] Date of Birth: 10-06-1966  [facility]) CM/SW Contact:  Lennart Pall, LCSW Phone Number: 10/29/2021, 9:41 AM   Clinical Narrative:    Met with patient and spoke with spouse about dc needs.  Wife confirms that she is borrowing a rolling walker but does need 3n1 commode  - no agency preference - order placed with [employer] for delivery to room.  Orders in for HHPT - again, no agency preference - order placed with [facility].  No further TOC needs.  Pt likely to dc home this afternoon.   Final next level of care: Home w Home Health Services Barriers to Discharge: No Barriers Identified   Patient Goals and CMS Choice Patient states their goals for this hospitalization and ongoing recovery are:: return home      Discharge Placement                       Discharge Plan and Services                DME Arranged: 3-N-1 DME Agency: [employer] Date DME Agency Contacted: 10/29/21 Time DME Agency Contacted: 0930 Representative spoke with at DME Agency: Zelphia [place] HH Arranged: PT [place]: [place] Date [place]: 10/29/21 Time [place]: [phone] Representative spoke with at [place]: [place] ([place]) Interventions     Readmission Risk Interventions No flowsheet data found.
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
## 2021-10-30 ENCOUNTER — Other Ambulatory Visit (HOSPITAL_COMMUNITY): Payer: Self-pay

## 2021-10-30 ENCOUNTER — Telehealth: Payer: Self-pay | Admitting: Orthopaedic Surgery

## 2021-10-30 ENCOUNTER — Encounter (HOSPITAL_COMMUNITY): Payer: Self-pay | Admitting: Orthopaedic Surgery

## 2021-10-30 DIAGNOSIS — F419 Anxiety disorder, unspecified: Secondary | ICD-10-CM | POA: Diagnosis not present

## 2021-10-30 DIAGNOSIS — S72012D Unspecified intracapsular fracture of left femur, subsequent encounter for closed fracture with routine healing: Secondary | ICD-10-CM | POA: Diagnosis not present

## 2021-10-30 DIAGNOSIS — F32A Depression, unspecified: Secondary | ICD-10-CM | POA: Diagnosis not present

## 2021-10-30 DIAGNOSIS — Z7982 Long term (current) use of aspirin: Secondary | ICD-10-CM | POA: Diagnosis not present

## 2021-10-30 DIAGNOSIS — L501 Idiopathic urticaria: Secondary | ICD-10-CM | POA: Diagnosis not present

## 2021-10-30 LAB — CBC
HCT: 26.8 % — ABNORMAL LOW (ref 39.0–52.0)
Hemoglobin: 9.4 g/dL — ABNORMAL LOW (ref 13.0–17.0)
MCH: 30.9 pg (ref 26.0–34.0)
MCHC: 35.1 g/dL (ref 30.0–36.0)
MCV: 88.2 fL (ref 80.0–100.0)
Platelets: 110 10*3/uL — ABNORMAL LOW (ref 150–400)
RBC: 3.04 MIL/uL — ABNORMAL LOW (ref 4.22–5.81)
RDW: 12.1 % (ref 11.5–15.5)
WBC: 5.6 10*3/uL (ref 4.0–10.5)
nRBC: 0 % (ref 0.0–0.2)

## 2021-10-30 LAB — COMPREHENSIVE METABOLIC PANEL
ALT: 23 U/L (ref 0–44)
AST: 63 U/L — ABNORMAL HIGH (ref 15–41)
Albumin: 3.1 g/dL — ABNORMAL LOW (ref 3.5–5.0)
Alkaline Phosphatase: 34 U/L — ABNORMAL LOW (ref 38–126)
Anion gap: 8 (ref 5–15)
BUN: 19 mg/dL (ref 6–20)
CO2: 23 mmol/L (ref 22–32)
Calcium: 8.1 mg/dL — ABNORMAL LOW (ref 8.9–10.3)
Chloride: 105 mmol/L (ref 98–111)
Creatinine, Ser: 0.77 mg/dL (ref 0.61–1.24)
GFR, Estimated: >60 mL/min (ref >60)
Glucose, Bld: 102 mg/dL — ABNORMAL HIGH (ref 70–99)
Potassium: 3.7 mmol/L (ref 3.5–5.1)
Sodium: 136 mmol/L (ref 135–145)
Total Bilirubin: 0.5 mg/dL (ref 0.3–1.2)
Total Protein: 5.7 g/dL — ABNORMAL LOW (ref 6.5–8.1)

## 2021-10-30 NOTE — Plan of Care (Signed)
  Problem: Education: Goal: Knowledge of General Education information will improve Description: Including pain rating scale, medication(s)/side effects and non-pharmacologic comfort measures Outcome: Adequate for Discharge   Problem: Health Behavior/Discharge Planning: Goal: Ability to manage health-related needs will improve Outcome: Adequate for Discharge   Problem: Clinical Measurements: Goal: Ability to maintain clinical measurements within normal limits will improve Outcome: Adequate for Discharge Goal: Will remain free from infection Outcome: Adequate for Discharge Goal: Diagnostic test results will improve Outcome: Adequate for Discharge Goal: Respiratory complications will improve Outcome: Adequate for Discharge Goal: Cardiovascular complication will be avoided Outcome: Adequate for Discharge   Problem: Activity: Goal: Risk for activity intolerance will decrease Outcome: Adequate for Discharge   Problem: Nutrition: Goal: Adequate nutrition will be maintained Outcome: Adequate for Discharge   Problem: Coping: Goal: Level of anxiety will decrease Outcome: Adequate for Discharge   Problem: Elimination: Goal: Will not experience complications related to bowel motility Outcome: Adequate for Discharge Goal: Will not experience complications related to urinary retention Outcome: Adequate for Discharge   Problem: Pain Managment: Goal: General experience of comfort will improve Outcome: Adequate for Discharge   Problem: Safety: Goal: Ability to remain free from injury will improve Outcome: Adequate for Discharge   Problem: Skin Integrity: Goal: Risk for impaired skin integrity will decrease Outcome: Adequate for Discharge   Problem: Acute Rehab PT Goals(only PT should resolve) Goal: Pt Will Go Supine/Side To Sit Outcome: Adequate for Discharge Goal: Pt Will Go Sit To Supine/Side Outcome: Adequate for Discharge Goal: Patient Will Transfer Sit To/From  Stand Outcome: Adequate for Discharge Goal: Pt Will Ambulate Outcome: Adequate for Discharge Goal: Pt Will Go Up/Down Stairs Outcome: Adequate for Discharge

## 2021-10-30 NOTE — Telephone Encounter (Signed)
LMOM for patient that we will see him at his 1st PO appt to determine if he needs to go to outpatient therapy at all and if he odes usually its not long

## 2021-10-30 NOTE — Telephone Encounter (Signed)
Kayla from [facility] called and is wondering if blackman had a time frame for how long pt will need outpatient? and if he had a frequency in mind for pt? If she has a pt she can't answer so just leave a message.  CB [phone]
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
## 2021-10-30 NOTE — Progress Notes (Signed)
Patient ID: Randy Meyer, male   DOB: 1966-04-04, 55 y.o.   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  He denies lightheadedness.  He has not been up today.  His afternoon session with physical therapy went very well yesterday.  They plan to work with him again today this morning.  He hopes to go ahead and be discharged to home later this morning.  His vital signs are stable.  His hemoglobin this morning is 9.4 which is stabilized.  I had a long and thorough discussion with him about things in general.  His left hip is stable.  I am fine with him being discharged home today and we will go ahead and put in those orders.

## 2021-11-01 ENCOUNTER — Other Ambulatory Visit: Payer: Self-pay | Admitting: *Deleted

## 2021-11-01 ENCOUNTER — Encounter: Payer: Self-pay | Admitting: *Deleted
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
## 2021-11-02 LAB — CULTURE, BLOOD (ROUTINE X 2)
Culture: NO GROWTH
Culture: NO GROWTH
Special Requests: ADEQUATE
Special Requests: ADEQUATE

## 2021-11-04 ENCOUNTER — Other Ambulatory Visit (HOSPITAL_COMMUNITY): Payer: Self-pay

## 2021-11-05 ENCOUNTER — Other Ambulatory Visit (HOSPITAL_COMMUNITY): Payer: Self-pay

## 2021-11-05 MED ORDER — LEVOCETIRIZINE DIHYDROCHLORIDE 5 MG PO TABS
5.0000 mg | ORAL_TABLET | Freq: Two times a day (BID) | ORAL | 3 refills | Status: DC
  Filled 2021-11-05: qty 180, 90d supply, fill #0

## 2021-11-06 DIAGNOSIS — F419 Anxiety disorder, unspecified: Secondary | ICD-10-CM | POA: Diagnosis not present

## 2021-11-06 DIAGNOSIS — S72012D Unspecified intracapsular fracture of left femur, subsequent encounter for closed fracture with routine healing: Secondary | ICD-10-CM | POA: Diagnosis not present

## 2021-11-06 DIAGNOSIS — F32A Depression, unspecified: Secondary | ICD-10-CM | POA: Diagnosis not present

## 2021-11-06 DIAGNOSIS — L501 Idiopathic urticaria: Secondary | ICD-10-CM | POA: Diagnosis not present

## 2021-11-06 DIAGNOSIS — Z7982 Long term (current) use of aspirin: Secondary | ICD-10-CM | POA: Diagnosis not present

## 2021-11-08 ENCOUNTER — Other Ambulatory Visit (HOSPITAL_COMMUNITY): Payer: Self-pay

## 2021-11-08 MED ORDER — TEMAZEPAM 15 MG PO CAPS
15.0000 mg | ORAL_CAPSULE | Freq: Every evening | ORAL | 0 refills | Status: DC | PRN
Start: 2021-11-08 — End: 2023-12-18
  Filled 2021-11-08: qty 30, 15d supply, fill #0

## 2021-11-08 MED FILL — Valacyclovir HCl Tab 1 GM: ORAL | 14 days supply | Qty: 28 | Fill #0 | Status: AC

## 2021-11-12 ENCOUNTER — Encounter: Payer: Self-pay | Admitting: Orthopaedic Surgery

## 2021-11-12 ENCOUNTER — Other Ambulatory Visit: Payer: Self-pay

## 2021-11-12 ENCOUNTER — Ambulatory Visit (INDEPENDENT_AMBULATORY_CARE_PROVIDER_SITE_OTHER): Payer: 59 | Admitting: Orthopaedic Surgery

## 2021-11-12 DIAGNOSIS — Z96642 Presence of left artificial hip joint: Secondary | ICD-10-CM
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
## 2021-11-14 ENCOUNTER — Encounter: Payer: 59 | Admitting: Orthopaedic Surgery

## 2021-11-20 DIAGNOSIS — F419 Anxiety disorder, unspecified: Secondary | ICD-10-CM | POA: Diagnosis not present

## 2021-11-23 ENCOUNTER — Ambulatory Visit (INDEPENDENT_AMBULATORY_CARE_PROVIDER_SITE_OTHER): Payer: 59

## 2021-11-23 DIAGNOSIS — J309 Allergic rhinitis, unspecified: Secondary | ICD-10-CM

## 2021-12-05 ENCOUNTER — Other Ambulatory Visit (HOSPITAL_COMMUNITY): Payer: Self-pay

## 2021-12-05 MED ORDER — CARESTART COVID-19 HOME TEST VI KIT
PACK | 0 refills | Status: DC
  Filled 2021-12-05: qty 4, 4d supply, fill #0

## 2021-12-18 ENCOUNTER — Ambulatory Visit (INDEPENDENT_AMBULATORY_CARE_PROVIDER_SITE_OTHER): Payer: 59 | Admitting: Orthopaedic Surgery

## 2021-12-18 ENCOUNTER — Encounter: Payer: Self-pay | Admitting: Orthopaedic Surgery

## 2021-12-18 DIAGNOSIS — Z96642 Presence of left artificial hip joint: Secondary | ICD-10-CM

## 2021-12-18 DIAGNOSIS — S72002A Fracture of unspecified part of neck of left femur, initial encounter for closed fracture: Secondary | ICD-10-CM
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
## 2021-12-25 DIAGNOSIS — F419 Anxiety disorder, unspecified: Secondary | ICD-10-CM | POA: Diagnosis not present

## 2021-12-26 ENCOUNTER — Ambulatory Visit (INDEPENDENT_AMBULATORY_CARE_PROVIDER_SITE_OTHER): Payer: 59

## 2021-12-26 DIAGNOSIS — J309 Allergic rhinitis, unspecified: Secondary | ICD-10-CM

## 2021-12-30 ENCOUNTER — Other Ambulatory Visit (HOSPITAL_COMMUNITY): Payer: Self-pay

## 2021-12-31 ENCOUNTER — Other Ambulatory Visit (HOSPITAL_COMMUNITY): Payer: Self-pay

## 2021-12-31 DIAGNOSIS — J301 Allergic rhinitis due to pollen: Secondary | ICD-10-CM | POA: Diagnosis not present

## 2021-12-31 MED ORDER — SERTRALINE HCL 25 MG PO TABS
25.0000 mg | ORAL_TABLET | Freq: Every day | ORAL | 3 refills | Status: DC
  Filled 2021-12-31: qty 90, 90d supply, fill #0
  Filled 2022-03-27: qty 90, 90d supply, fill #1

## 2021-12-31 NOTE — Progress Notes (Signed)
VIALS MADE. EXP 12-31-22 °

## 2022-01-21 ENCOUNTER — Other Ambulatory Visit (HOSPITAL_COMMUNITY): Payer: Self-pay

## 2022-01-25 ENCOUNTER — Ambulatory Visit (INDEPENDENT_AMBULATORY_CARE_PROVIDER_SITE_OTHER): Payer: 59 | Admitting: *Deleted

## 2022-01-25 DIAGNOSIS — J309 Allergic rhinitis, unspecified: Secondary | ICD-10-CM

## 2022-01-29 DIAGNOSIS — F419 Anxiety disorder, unspecified: Secondary | ICD-10-CM | POA: Diagnosis not present

## 2022-02-21 ENCOUNTER — Ambulatory Visit (INDEPENDENT_AMBULATORY_CARE_PROVIDER_SITE_OTHER): Payer: 59

## 2022-02-21 DIAGNOSIS — J309 Allergic rhinitis, unspecified: Secondary | ICD-10-CM | POA: Diagnosis not present

## 2022-02-24 ENCOUNTER — Other Ambulatory Visit (HOSPITAL_COMMUNITY): Payer: Self-pay

## 2022-02-25 ENCOUNTER — Other Ambulatory Visit (HOSPITAL_COMMUNITY): Payer: Self-pay

## 2022-02-25 MED ORDER — VALACYCLOVIR HCL 1 G PO TABS
1000.0000 mg | ORAL_TABLET | Freq: Two times a day (BID) | ORAL | 4 refills | Status: DC
Start: 2022-02-25 — End: 2022-05-01
  Filled 2022-02-25: qty 28, 14d supply, fill #0

## 2022-03-11 DIAGNOSIS — F419 Anxiety disorder, unspecified: Secondary | ICD-10-CM | POA: Diagnosis not present

## 2022-03-26 LAB — BASIC METABOLIC PANEL
BUN: 21 (ref 4–21)
CO2: 26 — AB (ref 13–22)
Chloride: 102 (ref 99–108)
Creatinine: 0.9 (ref 0.6–1.3)
Glucose: 90
Potassium: 4.4 mEq/L (ref 3.5–5.1)
Sodium: 140 (ref 137–147)

## 2022-03-26 LAB — COMPREHENSIVE METABOLIC PANEL
Albumin: 5 (ref 3.5–5.0)
Calcium: 9.6 (ref 8.7–10.7)
Globulin: 1.7
eGFR: 100

## 2022-03-26 LAB — HEPATIC FUNCTION PANEL
ALT: 17 U/L (ref 10–40)
AST: 24 (ref 14–40)
Alkaline Phosphatase: 74 (ref 25–125)
Bilirubin, Total: 0.4

## 2022-03-26 LAB — CBC: RBC: 5.32 — AB (ref 3.87–5.11)

## 2022-03-26 LAB — LIPID PANEL
Cholesterol: 172 (ref 0–200)
HDL: 61 (ref 35–70)
LDL Cholesterol: 99
Triglycerides: 59 (ref 40–160)

## 2022-03-26 LAB — CBC AND DIFFERENTIAL
HCT: 46 (ref 41–53)
Hemoglobin: 15.4 (ref 13.5–17.5)
Neutrophils Absolute: 56
Platelets: 175 10*3/uL (ref 150–400)
WBC: 5.3

## 2022-03-26 LAB — TSH: TSH: 1.92 (ref 0.41–5.90)

## 2022-03-26 LAB — PSA: PSA: 0.6

## 2022-03-26 LAB — HEMOGLOBIN A1C: Hemoglobin A1C: 5

## 2022-03-27 ENCOUNTER — Other Ambulatory Visit (HOSPITAL_COMMUNITY): Payer: Self-pay

## 2022-03-27 ENCOUNTER — Ambulatory Visit (INDEPENDENT_AMBULATORY_CARE_PROVIDER_SITE_OTHER): Payer: 59

## 2022-03-27 DIAGNOSIS — J309 Allergic rhinitis, unspecified: Secondary | ICD-10-CM | POA: Diagnosis not present

## 2022-03-28 DIAGNOSIS — J3089 Other allergic rhinitis: Secondary | ICD-10-CM | POA: Diagnosis not present

## 2022-03-28 DIAGNOSIS — L80 Vitiligo: Secondary | ICD-10-CM | POA: Diagnosis not present

## 2022-03-28 DIAGNOSIS — F419 Anxiety disorder, unspecified: Secondary | ICD-10-CM | POA: Diagnosis not present

## 2022-03-28 DIAGNOSIS — Z0000 Encounter for general adult medical examination without abnormal findings: Secondary | ICD-10-CM | POA: Diagnosis not present

## 2022-03-28 DIAGNOSIS — L508 Other urticaria: Secondary | ICD-10-CM | POA: Diagnosis not present

## 2022-04-08 ENCOUNTER — Ambulatory Visit (INDEPENDENT_AMBULATORY_CARE_PROVIDER_SITE_OTHER): Payer: 59 | Admitting: *Deleted

## 2022-04-08 DIAGNOSIS — J309 Allergic rhinitis, unspecified: Secondary | ICD-10-CM | POA: Diagnosis not present

## 2022-04-15 ENCOUNTER — Ambulatory Visit (INDEPENDENT_AMBULATORY_CARE_PROVIDER_SITE_OTHER): Payer: 59

## 2022-04-15 DIAGNOSIS — J309 Allergic rhinitis, unspecified: Secondary | ICD-10-CM

## 2022-04-22 ENCOUNTER — Ambulatory Visit (INDEPENDENT_AMBULATORY_CARE_PROVIDER_SITE_OTHER): Payer: 59

## 2022-04-22 DIAGNOSIS — J309 Allergic rhinitis, unspecified: Secondary | ICD-10-CM | POA: Diagnosis not present

## 2022-04-23 DIAGNOSIS — F419 Anxiety disorder, unspecified: Secondary | ICD-10-CM | POA: Diagnosis not present

## 2022-04-30 ENCOUNTER — Ambulatory Visit (INDEPENDENT_AMBULATORY_CARE_PROVIDER_SITE_OTHER): Payer: 59

## 2022-04-30 DIAGNOSIS — J309 Allergic rhinitis, unspecified: Secondary | ICD-10-CM

## 2022-05-01 ENCOUNTER — Other Ambulatory Visit (HOSPITAL_COMMUNITY): Payer: Self-pay

## 2022-05-01 ENCOUNTER — Encounter: Payer: Self-pay | Admitting: Family Medicine

## 2022-05-01 ENCOUNTER — Ambulatory Visit: Payer: 59 | Admitting: Family Medicine

## 2022-05-01 DIAGNOSIS — F32A Depression, unspecified: Secondary | ICD-10-CM

## 2022-05-01 DIAGNOSIS — Z91030 Bee allergy status: Secondary | ICD-10-CM

## 2022-05-01 DIAGNOSIS — L501 Idiopathic urticaria: Secondary | ICD-10-CM | POA: Diagnosis not present

## 2022-05-01 DIAGNOSIS — Z96642 Presence of left artificial hip joint: Secondary | ICD-10-CM

## 2022-05-01 DIAGNOSIS — H1010 Acute atopic conjunctivitis, unspecified eye: Secondary | ICD-10-CM

## 2022-05-01 DIAGNOSIS — L80 Vitiligo: Secondary | ICD-10-CM | POA: Diagnosis not present

## 2022-05-01 DIAGNOSIS — J309 Allergic rhinitis, unspecified: Secondary | ICD-10-CM | POA: Diagnosis not present

## 2022-05-01 DIAGNOSIS — B001 Herpesviral vesicular dermatitis: Secondary | ICD-10-CM | POA: Diagnosis not present

## 2022-05-01 DIAGNOSIS — M545 Low back pain, unspecified: Secondary | ICD-10-CM | POA: Diagnosis not present

## 2022-05-01 MED ORDER — SERTRALINE HCL 25 MG PO TABS
25.0000 mg | ORAL_TABLET | Freq: Every day | ORAL | 3 refills | Status: DC
  Filled 2022-05-01 – 2022-06-27 (×2): qty 90, 90d supply, fill #0
  Filled 2022-10-01: qty 90, 90d supply, fill #1
  Filled 2022-12-24 – 2022-12-30 (×2): qty 90, 90d supply, fill #2
  Filled 2023-03-27: qty 90, 90d supply, fill #3

## 2022-05-01 NOTE — Patient Instructions (Addendum)
No changes today - glad you are doing well ? ?Recommended follow up: Return in about 1 year (around 05/02/2023) for physical or sooner if needed.Schedule b4 you leave. ?
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
## 2022-05-06 ENCOUNTER — Other Ambulatory Visit (HOSPITAL_COMMUNITY): Payer: Self-pay

## 2022-05-06 ENCOUNTER — Other Ambulatory Visit: Payer: Self-pay | Admitting: Family Medicine

## 2022-05-06 MED ORDER — LEVOCETIRIZINE DIHYDROCHLORIDE 5 MG PO TABS
5.0000 mg | ORAL_TABLET | Freq: Two times a day (BID) | ORAL | 4 refills | Status: DC
  Filled 2022-05-06: qty 180, 90d supply, fill #0

## 2022-05-07 ENCOUNTER — Ambulatory Visit: Payer: 59 | Admitting: Allergy and Immunology

## 2022-05-07 ENCOUNTER — Encounter: Payer: Self-pay | Admitting: Allergy and Immunology

## 2022-05-07 ENCOUNTER — Other Ambulatory Visit (HOSPITAL_COMMUNITY): Payer: Self-pay

## 2022-05-07 VITALS — BP 110/70 | HR 62 | Temp 97.4°F | Resp 16 | Ht 70.0 in | Wt 161.0 lb

## 2022-05-07 DIAGNOSIS — J3089 Other allergic rhinitis: Secondary | ICD-10-CM | POA: Diagnosis not present

## 2022-05-07 DIAGNOSIS — L500 Allergic urticaria: Secondary | ICD-10-CM

## 2022-05-07 MED ORDER — FLUTICASONE PROPIONATE 50 MCG/ACT NA SUSP
1.0000 | Freq: Every day | NASAL | 11 refills | Status: AC
  Filled 2022-05-07: qty 16, 60d supply, fill #0

## 2022-05-07 MED ORDER — LEVOCETIRIZINE DIHYDROCHLORIDE 5 MG PO TABS
5.0000 mg | ORAL_TABLET | Freq: Two times a day (BID) | ORAL | 4 refills | Status: DC | PRN
Start: 2022-05-07 — End: 2023-10-28
  Filled 2022-05-07 – 2022-10-14 (×2): qty 180, 90d supply, fill #0
  Filled 2023-04-30: qty 180, 90d supply, fill #1

## 2022-05-07 MED ORDER — EPINEPHRINE 0.3 MG/0.3ML IJ SOAJ: 0.3000 mg | INTRAMUSCULAR | 2 refills | Status: DC | PRN

## 2022-05-07 NOTE — Patient Instructions (Addendum)
?  1.  Continue immunotherapy ? ?2.  Continue Flonase and antihistamine if needed ? ?3.  Epi-Pen / Auvi-Q 0.3 if needed ? ?4. Return to clinic in 1 year or earlier if problem ?
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
## 2022-05-08 ENCOUNTER — Encounter: Payer: Self-pay | Admitting: Allergy and Immunology

## 2022-05-13 ENCOUNTER — Other Ambulatory Visit (HOSPITAL_COMMUNITY): Payer: Self-pay

## 2022-05-14 ENCOUNTER — Other Ambulatory Visit (HOSPITAL_COMMUNITY): Payer: Self-pay

## 2022-05-14 ENCOUNTER — Other Ambulatory Visit: Payer: Self-pay

## 2022-05-14 MED ORDER — EPINEPHRINE 0.3 MG/0.3ML IJ SOAJ
0.3000 mg | Freq: Once | INTRAMUSCULAR | 1 refills | Status: AC
  Filled 2022-05-14: qty 2, 2d supply, fill #0

## 2022-05-27 ENCOUNTER — Ambulatory Visit (INDEPENDENT_AMBULATORY_CARE_PROVIDER_SITE_OTHER): Payer: 59

## 2022-05-27 DIAGNOSIS — J309 Allergic rhinitis, unspecified: Secondary | ICD-10-CM | POA: Diagnosis not present

## 2022-05-28 DIAGNOSIS — F419 Anxiety disorder, unspecified: Secondary | ICD-10-CM | POA: Diagnosis not present

## 2022-06-18 ENCOUNTER — Ambulatory Visit: Payer: 59 | Admitting: Orthopaedic Surgery

## 2022-06-27 ENCOUNTER — Other Ambulatory Visit: Payer: Self-pay | Admitting: Family Medicine

## 2022-06-27 ENCOUNTER — Other Ambulatory Visit (HOSPITAL_COMMUNITY): Payer: Self-pay

## 2022-06-27 MED ORDER — EPINEPHRINE 0.3 MG/0.3ML IJ SOAJ
0.3000 mg | INTRAMUSCULAR | 3 refills | Status: DC
  Filled 2022-06-27: qty 2, 7d supply, fill #0

## 2022-06-28 ENCOUNTER — Ambulatory Visit (INDEPENDENT_AMBULATORY_CARE_PROVIDER_SITE_OTHER): Payer: 59 | Admitting: *Deleted

## 2022-06-28 ENCOUNTER — Other Ambulatory Visit (HOSPITAL_COMMUNITY): Payer: Self-pay

## 2022-06-28 DIAGNOSIS — J309 Allergic rhinitis, unspecified: Secondary | ICD-10-CM

## 2022-07-01 ENCOUNTER — Ambulatory Visit (INDEPENDENT_AMBULATORY_CARE_PROVIDER_SITE_OTHER): Payer: 59

## 2022-07-01 ENCOUNTER — Encounter: Payer: Self-pay | Admitting: Orthopaedic Surgery

## 2022-07-01 ENCOUNTER — Ambulatory Visit: Payer: 59 | Admitting: Orthopaedic Surgery

## 2022-07-01 DIAGNOSIS — Z96642 Presence of left artificial hip joint: Secondary | ICD-10-CM | POA: Diagnosis not present
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
## 2022-07-02 DIAGNOSIS — F419 Anxiety disorder, unspecified: Secondary | ICD-10-CM | POA: Diagnosis not present

## 2022-07-04 ENCOUNTER — Other Ambulatory Visit (HOSPITAL_COMMUNITY): Payer: Self-pay

## 2022-07-04 MED ORDER — TEMAZEPAM 15 MG PO CAPS
15.0000 mg | ORAL_CAPSULE | Freq: Every evening | ORAL | 2 refills | Status: AC | PRN
  Filled 2022-07-04: qty 30, 15d supply, fill #0

## 2022-07-24 ENCOUNTER — Ambulatory Visit (INDEPENDENT_AMBULATORY_CARE_PROVIDER_SITE_OTHER): Payer: 59

## 2022-07-24 DIAGNOSIS — J309 Allergic rhinitis, unspecified: Secondary | ICD-10-CM

## 2022-08-06 DIAGNOSIS — F419 Anxiety disorder, unspecified: Secondary | ICD-10-CM | POA: Diagnosis not present

## 2022-08-21 ENCOUNTER — Telehealth: Payer: Self-pay | Admitting: Orthopaedic Surgery

## 2022-08-21 NOTE — Telephone Encounter (Signed)
Patient walked in office request a note that he states he spoke with Autumn about regarding FMLA

## 2022-08-21 NOTE — Telephone Encounter (Signed)
I called and talked to the pt. He stated he needs a note stating he had surgery on 10/27/21 and was out of work from 10/27/21-11/02/21. He needed this faxed to [facility] @ [phone]. This was completed and faxed for him. A copy of the note was sent to his mychart

## 2022-09-06 ENCOUNTER — Ambulatory Visit (INDEPENDENT_AMBULATORY_CARE_PROVIDER_SITE_OTHER): Payer: 59 | Admitting: *Deleted

## 2022-09-06 DIAGNOSIS — J309 Allergic rhinitis, unspecified: Secondary | ICD-10-CM

## 2022-09-16 ENCOUNTER — Encounter: Payer: Self-pay | Admitting: *Deleted

## 2022-09-26 ENCOUNTER — Ambulatory Visit (INDEPENDENT_AMBULATORY_CARE_PROVIDER_SITE_OTHER): Payer: 59

## 2022-09-26 DIAGNOSIS — J309 Allergic rhinitis, unspecified: Secondary | ICD-10-CM | POA: Diagnosis not present

## 2022-10-01 ENCOUNTER — Other Ambulatory Visit: Payer: Self-pay | Admitting: Family Medicine

## 2022-10-01 ENCOUNTER — Other Ambulatory Visit (HOSPITAL_COMMUNITY): Payer: Self-pay

## 2022-10-01 DIAGNOSIS — F419 Anxiety disorder, unspecified: Secondary | ICD-10-CM | POA: Diagnosis not present

## 2022-10-01 MED ORDER — VALACYCLOVIR HCL 1 G PO TABS
1000.0000 mg | ORAL_TABLET | Freq: Two times a day (BID) | ORAL | 4 refills | Status: DC
  Filled 2022-10-01: qty 28, 14d supply, fill #0
  Filled 2023-03-06: qty 28, 14d supply, fill #1

## 2022-10-02 DIAGNOSIS — J3089 Other allergic rhinitis: Secondary | ICD-10-CM | POA: Diagnosis not present

## 2022-10-02 NOTE — Progress Notes (Signed)
VIALS EXP 10-03-23

## 2022-10-14 ENCOUNTER — Other Ambulatory Visit (HOSPITAL_COMMUNITY): Payer: Self-pay

## 2022-10-24 ENCOUNTER — Ambulatory Visit (INDEPENDENT_AMBULATORY_CARE_PROVIDER_SITE_OTHER): Payer: 59

## 2022-10-24 DIAGNOSIS — J309 Allergic rhinitis, unspecified: Secondary | ICD-10-CM | POA: Diagnosis not present

## 2022-12-02 ENCOUNTER — Ambulatory Visit (INDEPENDENT_AMBULATORY_CARE_PROVIDER_SITE_OTHER): Payer: 59

## 2022-12-02 DIAGNOSIS — J309 Allergic rhinitis, unspecified: Secondary | ICD-10-CM | POA: Diagnosis not present

## 2022-12-03 DIAGNOSIS — F419 Anxiety disorder, unspecified: Secondary | ICD-10-CM | POA: Diagnosis not present

## 2022-12-05 ENCOUNTER — Encounter: Payer: Self-pay | Admitting: *Deleted

## 2022-12-10 ENCOUNTER — Ambulatory Visit (INDEPENDENT_AMBULATORY_CARE_PROVIDER_SITE_OTHER): Payer: 59

## 2022-12-10 DIAGNOSIS — J309 Allergic rhinitis, unspecified: Secondary | ICD-10-CM | POA: Diagnosis not present

## 2022-12-24 ENCOUNTER — Other Ambulatory Visit (HOSPITAL_COMMUNITY): Payer: Self-pay

## 2022-12-24 ENCOUNTER — Other Ambulatory Visit: Payer: Self-pay

## 2022-12-24 ENCOUNTER — Ambulatory Visit (INDEPENDENT_AMBULATORY_CARE_PROVIDER_SITE_OTHER): Payer: Commercial Managed Care - PPO

## 2022-12-24 DIAGNOSIS — J309 Allergic rhinitis, unspecified: Secondary | ICD-10-CM

## 2022-12-30 ENCOUNTER — Ambulatory Visit (INDEPENDENT_AMBULATORY_CARE_PROVIDER_SITE_OTHER): Payer: Commercial Managed Care - PPO

## 2022-12-30 DIAGNOSIS — J309 Allergic rhinitis, unspecified: Secondary | ICD-10-CM | POA: Diagnosis not present

## 2023-01-09 ENCOUNTER — Ambulatory Visit (INDEPENDENT_AMBULATORY_CARE_PROVIDER_SITE_OTHER): Payer: Commercial Managed Care - PPO

## 2023-01-09 DIAGNOSIS — J309 Allergic rhinitis, unspecified: Secondary | ICD-10-CM

## 2023-01-14 DIAGNOSIS — F419 Anxiety disorder, unspecified: Secondary | ICD-10-CM | POA: Diagnosis not present

## 2023-01-16 ENCOUNTER — Ambulatory Visit (INDEPENDENT_AMBULATORY_CARE_PROVIDER_SITE_OTHER): Payer: Commercial Managed Care - PPO

## 2023-01-16 DIAGNOSIS — J309 Allergic rhinitis, unspecified: Secondary | ICD-10-CM

## 2023-01-20 ENCOUNTER — Ambulatory Visit (INDEPENDENT_AMBULATORY_CARE_PROVIDER_SITE_OTHER): Payer: Commercial Managed Care - PPO

## 2023-01-20 DIAGNOSIS — J309 Allergic rhinitis, unspecified: Secondary | ICD-10-CM | POA: Diagnosis not present

## 2023-02-11 DIAGNOSIS — F419 Anxiety disorder, unspecified: Secondary | ICD-10-CM | POA: Diagnosis not present

## 2023-02-25 ENCOUNTER — Ambulatory Visit (INDEPENDENT_AMBULATORY_CARE_PROVIDER_SITE_OTHER): Payer: Commercial Managed Care - PPO

## 2023-02-25 DIAGNOSIS — J309 Allergic rhinitis, unspecified: Secondary | ICD-10-CM | POA: Diagnosis not present

## 2023-03-05 LAB — LAB REPORT - SCANNED
A1c: 5.1
EGFR: 99

## 2023-03-13 DIAGNOSIS — F419 Anxiety disorder, unspecified: Secondary | ICD-10-CM | POA: Diagnosis not present

## 2023-03-15 ENCOUNTER — Encounter: Payer: Self-pay | Admitting: Family Medicine

## 2023-03-27 ENCOUNTER — Other Ambulatory Visit: Payer: Self-pay

## 2023-03-27 ENCOUNTER — Other Ambulatory Visit (HOSPITAL_COMMUNITY): Payer: Self-pay

## 2023-04-01 ENCOUNTER — Ambulatory Visit (INDEPENDENT_AMBULATORY_CARE_PROVIDER_SITE_OTHER): Payer: Commercial Managed Care - PPO

## 2023-04-01 DIAGNOSIS — J309 Allergic rhinitis, unspecified: Secondary | ICD-10-CM | POA: Diagnosis not present

## 2023-04-09 ENCOUNTER — Encounter: Payer: Self-pay | Admitting: Family Medicine

## 2023-04-17 ENCOUNTER — Ambulatory Visit: Payer: Self-pay | Admitting: Sports Medicine

## 2023-04-22 ENCOUNTER — Ambulatory Visit: Payer: Commercial Managed Care - PPO | Admitting: Sports Medicine

## 2023-04-22 VITALS — BP 102/60 | Ht 69.0 in | Wt 155.0 lb

## 2023-04-22 DIAGNOSIS — F419 Anxiety disorder, unspecified: Secondary | ICD-10-CM | POA: Diagnosis not present

## 2023-04-22 DIAGNOSIS — S46212A Strain of muscle, fascia and tendon of other parts of biceps, left arm, initial encounter: Secondary | ICD-10-CM

## 2023-04-22 DIAGNOSIS — M542 Cervicalgia: Secondary | ICD-10-CM

## 2023-04-22 DIAGNOSIS — S46219A Strain of muscle, fascia and tendon of other parts of biceps, unspecified arm, initial encounter: Secondary | ICD-10-CM | POA: Insufficient documentation

## 2023-04-22 NOTE — Progress Notes (Addendum)
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 He has started noticing some dull pain in his left biceps and tightness that was not associated with injury He has been able to define that this is positional If he leans forward even on the computer he will sometimes feel it As he leans forward on the bike for period of time he definitely feels that  Review of systems does not reveal significant neck pain He does not feel that the neck is stiff He does not have radicular symptoms that radiate down his arm into his fingers  He is not aware of shoulder injury or any specific weakness He does do some paddling  Physical exam Pleasant white male in no acute distress BP 102/60   Ht 5\' 9"  (1.753 m)   Wt 155 lb (70.3 kg)   BMI 22.89 kg/m   Neck shows full range of motion No provocative tests were painful  Shoulder: Left Inspection reveals no abnormalities, atrophy or asymmetry. Palpation is normal with no tenderness over AC joint or bicipital groove. ROM is full in all planes. Rotator cuff strength normal throughout. No signs of impingement with negative Neer and Hawkin's tests, empty can. Speeds and Yergason's tests normal. No labral pathology noted with negative Obrien's, negative clunk and good stability. Normal scapular function observed. No painful arc and no drop arm sign. No apprehension sign  Flexors were normal in both upper extremities  04/26/23  XR reviewed and films look normal.  I discussed with Trey Paula and we will see how his bike fit affects his sxs.  If not resolving we will scan his shoulder and arm with [place]. MRI in the future if we don't find a cause.

## 2023-04-22 NOTE — Assessment & Plan Note (Signed)
We will check neck films with obliques to see if there is any foraminal narrowing or something that would cause nerve entrapment to a C5 or VI nerve root that would innervate his biceps His rotator cuff exam is unremarkable His neck exam is unremarkable  No other specific causes for his symptoms were clear from today's exam  He did go ahead and do a bike fit assessment to see if the position on the bike was triggering this We may suggest other exercises pending what we see on x-ray

## 2023-04-23 ENCOUNTER — Ambulatory Visit
Admission: RE | Admit: 2023-04-23 | Discharge: 2023-04-23 | Disposition: A | Discharge status: Home / Self Care | Payer: Commercial Managed Care - PPO | Source: Ambulatory Visit | Attending: Sports Medicine | Admitting: Sports Medicine

## 2023-04-23 DIAGNOSIS — M542 Cervicalgia: Secondary | ICD-10-CM | POA: Diagnosis not present

## 2023-04-29 ENCOUNTER — Ambulatory Visit (INDEPENDENT_AMBULATORY_CARE_PROVIDER_SITE_OTHER): Payer: Commercial Managed Care - PPO

## 2023-04-29 DIAGNOSIS — J309 Allergic rhinitis, unspecified: Secondary | ICD-10-CM | POA: Diagnosis not present
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
## 2023-05-30 ENCOUNTER — Ambulatory Visit (INDEPENDENT_AMBULATORY_CARE_PROVIDER_SITE_OTHER): Payer: Commercial Managed Care - PPO | Admitting: *Deleted

## 2023-05-30 DIAGNOSIS — J309 Allergic rhinitis, unspecified: Secondary | ICD-10-CM

## 2023-06-23 ENCOUNTER — Ambulatory Visit (INDEPENDENT_AMBULATORY_CARE_PROVIDER_SITE_OTHER): Payer: Commercial Managed Care - PPO

## 2023-06-23 DIAGNOSIS — J309 Allergic rhinitis, unspecified: Secondary | ICD-10-CM

## 2023-06-24 DIAGNOSIS — F419 Anxiety disorder, unspecified: Secondary | ICD-10-CM | POA: Diagnosis not present

## 2023-06-25 DIAGNOSIS — J3089 Other allergic rhinitis: Secondary | ICD-10-CM | POA: Diagnosis not present

## 2023-06-25 NOTE — Progress Notes (Signed)
VIALS EXP 06-24-24 

## 2023-07-08 ENCOUNTER — Other Ambulatory Visit: Payer: Self-pay | Admitting: Family Medicine

## 2023-07-08 ENCOUNTER — Other Ambulatory Visit (HOSPITAL_COMMUNITY): Payer: Self-pay

## 2023-07-08 MED ORDER — SERTRALINE HCL 25 MG PO TABS
25.0000 mg | ORAL_TABLET | Freq: Every day | ORAL | 3 refills | Status: DC
  Filled 2023-07-08: qty 90, 90d supply, fill #0
  Filled 2023-09-26: qty 90, 90d supply, fill #1
  Filled 2023-12-26: qty 90, 90d supply, fill #2
  Filled 2024-03-28: qty 90, 90d supply, fill #3

## 2023-08-04 ENCOUNTER — Ambulatory Visit: Payer: Self-pay

## 2023-08-04 DIAGNOSIS — J309 Allergic rhinitis, unspecified: Secondary | ICD-10-CM

## 2023-08-05 DIAGNOSIS — F419 Anxiety disorder, unspecified: Secondary | ICD-10-CM | POA: Diagnosis not present

## 2023-08-07 DIAGNOSIS — D224 Melanocytic nevi of scalp and neck: Secondary | ICD-10-CM | POA: Diagnosis not present

## 2023-08-07 DIAGNOSIS — L720 Epidermal cyst: Secondary | ICD-10-CM | POA: Diagnosis not present

## 2023-08-07 DIAGNOSIS — L80 Vitiligo: Secondary | ICD-10-CM | POA: Diagnosis not present

## 2023-08-07 DIAGNOSIS — D225 Melanocytic nevi of trunk: Secondary | ICD-10-CM | POA: Diagnosis not present

## 2023-08-07 DIAGNOSIS — D2262 Melanocytic nevi of left upper limb, including shoulder: Secondary | ICD-10-CM | POA: Diagnosis not present

## 2023-08-07 DIAGNOSIS — D2271 Melanocytic nevi of right lower limb, including hip: Secondary | ICD-10-CM | POA: Diagnosis not present

## 2023-08-29 ENCOUNTER — Ambulatory Visit (INDEPENDENT_AMBULATORY_CARE_PROVIDER_SITE_OTHER): Payer: Commercial Managed Care - PPO

## 2023-08-29 DIAGNOSIS — J309 Allergic rhinitis, unspecified: Secondary | ICD-10-CM

## 2023-09-04 ENCOUNTER — Ambulatory Visit (INDEPENDENT_AMBULATORY_CARE_PROVIDER_SITE_OTHER): Payer: Commercial Managed Care - PPO

## 2023-09-04 DIAGNOSIS — J309 Allergic rhinitis, unspecified: Secondary | ICD-10-CM | POA: Diagnosis not present

## 2023-09-09 DIAGNOSIS — F419 Anxiety disorder, unspecified: Secondary | ICD-10-CM | POA: Diagnosis not present

## 2023-09-10 ENCOUNTER — Ambulatory Visit (INDEPENDENT_AMBULATORY_CARE_PROVIDER_SITE_OTHER): Payer: Commercial Managed Care - PPO | Admitting: *Deleted

## 2023-09-10 DIAGNOSIS — J309 Allergic rhinitis, unspecified: Secondary | ICD-10-CM | POA: Diagnosis not present

## 2023-09-15 ENCOUNTER — Ambulatory Visit (INDEPENDENT_AMBULATORY_CARE_PROVIDER_SITE_OTHER): Payer: Commercial Managed Care - PPO

## 2023-09-15 DIAGNOSIS — J309 Allergic rhinitis, unspecified: Secondary | ICD-10-CM

## 2023-09-22 ENCOUNTER — Ambulatory Visit (INDEPENDENT_AMBULATORY_CARE_PROVIDER_SITE_OTHER): Payer: Commercial Managed Care - PPO

## 2023-09-22 DIAGNOSIS — J309 Allergic rhinitis, unspecified: Secondary | ICD-10-CM

## 2023-10-21 DIAGNOSIS — F419 Anxiety disorder, unspecified: Secondary | ICD-10-CM | POA: Diagnosis not present

## 2023-10-23 ENCOUNTER — Other Ambulatory Visit: Payer: Self-pay | Admitting: Family Medicine

## 2023-10-24 ENCOUNTER — Other Ambulatory Visit (HOSPITAL_COMMUNITY): Payer: Self-pay

## 2023-10-27 ENCOUNTER — Other Ambulatory Visit: Payer: Self-pay | Admitting: Allergy and Immunology

## 2023-10-27 ENCOUNTER — Other Ambulatory Visit (HOSPITAL_COMMUNITY): Payer: Self-pay

## 2023-10-27 ENCOUNTER — Other Ambulatory Visit: Payer: Self-pay | Admitting: Family Medicine

## 2023-10-28 ENCOUNTER — Other Ambulatory Visit (HOSPITAL_COMMUNITY): Payer: Self-pay

## 2023-10-28 ENCOUNTER — Other Ambulatory Visit: Payer: Self-pay

## 2023-10-28 ENCOUNTER — Other Ambulatory Visit: Payer: Self-pay | Admitting: Family Medicine

## 2023-10-28 MED ORDER — VALACYCLOVIR HCL 1 G PO TABS
2000.0000 mg | ORAL_TABLET | Freq: Two times a day (BID) | ORAL | 2 refills | Status: AC
Start: 2023-10-28 — End: ?
  Filled 2023-10-28 – 2024-06-07 (×2): qty 30, 8d supply, fill #0
  Filled 2024-10-14: qty 30, 8d supply, fill #1

## 2023-10-28 MED ORDER — VALACYCLOVIR HCL 1 G PO TABS
1000.0000 mg | ORAL_TABLET | Freq: Two times a day (BID) | ORAL | 1 refills | Status: DC
  Filled 2023-10-28: qty 28, 14d supply, fill #0

## 2023-10-28 MED ORDER — LEVOCETIRIZINE DIHYDROCHLORIDE 5 MG PO TABS
5.0000 mg | ORAL_TABLET | Freq: Two times a day (BID) | ORAL | 4 refills | Status: AC | PRN
  Filled 2023-10-28: qty 180, 90d supply, fill #0
  Filled 2024-05-14: qty 180, 90d supply, fill #1
  Filled 2024-10-27: qty 180, 90d supply, fill #2

## 2023-10-28 NOTE — Telephone Encounter (Signed)
Pt has been scheduled for 12/12/23 ( next opening). Can he receive enough meds to last until this visit?

## 2023-10-29 ENCOUNTER — Other Ambulatory Visit: Payer: Self-pay

## 2023-10-29 ENCOUNTER — Ambulatory Visit (INDEPENDENT_AMBULATORY_CARE_PROVIDER_SITE_OTHER): Payer: Self-pay | Admitting: *Deleted

## 2023-10-29 ENCOUNTER — Other Ambulatory Visit (HOSPITAL_COMMUNITY): Payer: Self-pay

## 2023-10-29 DIAGNOSIS — J309 Allergic rhinitis, unspecified: Secondary | ICD-10-CM | POA: Diagnosis not present

## 2023-11-25 ENCOUNTER — Ambulatory Visit (INDEPENDENT_AMBULATORY_CARE_PROVIDER_SITE_OTHER): Payer: Self-pay | Admitting: *Deleted

## 2023-11-25 DIAGNOSIS — J309 Allergic rhinitis, unspecified: Secondary | ICD-10-CM

## 2023-12-12 ENCOUNTER — Encounter: Payer: Self-pay | Admitting: Family Medicine

## 2023-12-12 ENCOUNTER — Telehealth: Payer: Self-pay | Admitting: Internal Medicine

## 2023-12-12 ENCOUNTER — Other Ambulatory Visit (HOSPITAL_COMMUNITY): Payer: Self-pay

## 2023-12-12 ENCOUNTER — Ambulatory Visit: Payer: Commercial Managed Care - PPO | Admitting: Family Medicine

## 2023-12-12 VITALS — BP 110/72 | HR 60 | Temp 97.0°F | Ht 69.0 in | Wt 159.4 lb

## 2023-12-12 DIAGNOSIS — Z0000 Encounter for general adult medical examination without abnormal findings: Secondary | ICD-10-CM

## 2023-12-12 DIAGNOSIS — K625 Hemorrhage of anus and rectum: Secondary | ICD-10-CM

## 2023-12-12 MED ORDER — EPINEPHRINE 0.3 MG/0.3ML IJ SOAJ
0.3000 mg | INTRAMUSCULAR | 2 refills | Status: AC | PRN
  Filled 2023-12-12: qty 2, 2d supply, fill #0
  Filled 2023-12-29: qty 2, 2d supply, fill #1

## 2023-12-12 NOTE — Progress Notes (Signed)
Phone: [phone]   Subjective:  Patient presents today for their annual physical. Chief complaint-noted.   See problem oriented charting- ROS- full  review of systems was completed and negative  except for: bright red blood per rectum   The following were reviewed and entered/updated in epic: Past Medical History:  Diagnosis Date   Allergy    Anxiety    sertraline through PCP- since 1999   Depression    History of anal fissures    with cycling   Patient Active Problem List   Diagnosis Date Noted   Bee sting allergy 05/01/2022    Priority: Medium    Vitiligo 05/01/2022    Priority: Medium    Depression 10/26/2021    Priority: Medium    Chronic idiopathic urticaria 08/26/2015    Priority: Medium    Allergic rhinoconjunctivitis 08/26/2015    Priority: Medium    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    Priority: Low   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    Priority: Low   Low back pain 07/16/2017    Priority: Low   Gastrocnemius muscle tear 07/18/2015    Priority: Low   Biceps strain 04/22/2023   Past Surgical History:  Procedure Laterality Date   MOUTH SURGERY     gum grafting x3   TOTAL HIP ARTHROPLASTY Left 10/27/2021   Procedure: TOTAL HIP ARTHROPLASTY ANTERIOR APPROACH;  Surgeon: Kathryne Hitch, MD;  Location: [facility];  Service: Orthopedics;  Laterality: Left;   VASECTOMY     WISDOM TOOTH EXTRACTION      Family History  Problem Relation Age of Onset   Alzheimer's disease Mother        20 in 2023   Hypertension Mother    Heart disease Father        mid 67s   Prostate cancer Father        in 36s   Hypertension Father    Vasculitis Father        in rehab got covid and died at 84   Healthy Sister    Healthy Sister    Alzheimer's disease Maternal Grandmother    Alzheimer's disease Maternal Grandfather    Parkinson's disease Paternal Grandmother    Lung cancer Paternal Grandfather        lifelong smoker   Colon cancer Neg Hx     Esophageal cancer Neg Hx    Rectal cancer Neg Hx    Stomach cancer Neg Hx     Medications- reviewed and updated Current Outpatient Medications  Medication Sig Dispense Refill   fluticasone (FLONASE) 50 MCG/ACT nasal spray Place 1 spray into both nostrils daily. 16 g 11   levocetirizine (XYZAL) 5 MG tablet Take 1 tablet (5 mg total) by mouth 2 (two) times daily as needed for allergies (Can take an extra dose during flare ups.). 180 tablet 4   loratadine (CLARITIN) 10 MG tablet Take 10 mg by mouth daily.     Omega-3 Fatty Acids (FISH OIL) 1000 MG CAPS Take 1,000 mg by mouth daily.     sertraline (ZOLOFT) 25 MG tablet Take 1 tablet (25 mg total) by mouth daily. 90 tablet 3   temazepam (RESTORIL) 15 MG capsule Take 1-2 capsules (15-30 mg total) by mouth at bedtime as needed. 30 capsule 0   EPINEPHrine (AUVI-Q) 0.3 mg/0.3 mL IJ SOAJ injection Inject 0.3 mg into the muscle as needed for anaphylaxis, as directed for life-threatening allergic reactions 2 each 2   temazepam (RESTORIL) 15 MG capsule  Take 1-2 capsules (15-30 mg total) by mouth at bedtime as needed. (Patient not taking: Reported on 12/12/2023) 30 capsule 2   valACYclovir (VALTREX) 1000 MG tablet Take 2 tablets (2,000 mg total) by mouth 2 (two) times daily for 1 day at first sign of cold sore/fever blister. (Patient not taking: Reported on 12/12/2023) 30 tablet 2   No current facility-administered medications for this visit.    Allergies-reviewed and updated Allergies  Allergen Reactions   Ibuprofen Hives    Social History   Social History Narrative   Married. 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 Oropharynx normal Neck: no thyromegaly CV: RRR no murmurs rubs or gallops Lungs: CTAB no crackles, wheeze, rhonchi Abdomen: soft/nontender/nondistended/normal bowel sounds. No rebound or guarding.  Ext: no edema Skin: warm, dry Neuro: grossly normal, moves all extremities, PERRLA External rectal exam with no obvious hemorrhoids   Assessment and Plan  57 y.o. male presenting for annual physical.  Health Maintenance counseling: 1. Anticipatory guidance: Patient counseled regarding regular dental exams -q6 months, eye exams -yearly,  avoiding smoking and second hand smoke , limiting alcohol to 2 beverages per day - 4 per week, no illicit drugs .   2. Risk factor reduction:  Advised patient of need for regular exercise and diet rich and fruits and vegetables to reduce risk of heart attack and stroke.  Exercise- still biking regularly- was able to get back to running as well- is planning on [employer]. 4-5 days a week  Diet/weight management-healthy weight, reasonably healthy diet.  Wt Readings from Last 3 Encounters:  12/12/23 159 lb 6.4 oz (72.3 kg)  04/22/23 155 lb (70.3 kg)  05/07/22 161 lb (73 kg)  3. Immunizations/screenings/ancillary studies-- had COVID at pharmacy, has had flu shot, otherwise up to date  Immunization History  Administered Date(s) Administered   Influenza-Unspecified 09/09/2023   Tdap 08/15/2023   Typhoid Live 07/12/2011, 08/30/2016   Yellow Fever 08/30/2016   Zoster Recombinant(Shingrix) 11/11/2018, 01/29/2019   4. Prostate cancer screening-  low risk prior trend- had doctors day labs and will send [place] a copy-  Lab  Results  Component Value Date   PSA 0.6 03/26/2022   PSA 0.4 09/14/2019   5. Colon cancer screening - 10/22/16 with 10 year repeat planned- but see bright red blood per rectum  6. Skin cancer screening- [place] [facility] Dr. Doreen Beam, advised regular sunscreen use. Denies worrisome, changing, or new skin lesions.  7. Smoking associated screening (lung cancer screening, AAA screen 65-75, UA)- never smoker 8. STD screening - only active with wife  Status of chronic or acute concerns   # Depression/anxiety S: Medication: sertraline 25  mg    12/12/2023    9:05 AM 05/01/2022   11:00 AM  Depression screen PHQ 2/9  Decreased Interest 0 0  Down, Depressed, Hopeless 1 0  PHQ - 2 Score 1 0  Altered sleeping 1 0  Tired, decreased energy 0 0  Change in appetite 0 0  Feeling bad or failure about yourself  0 0  Trouble concentrating 0 0  Moving slowly or fidgety/restless 1 0  Suicidal thoughts 0 0  PHQ-9 Score 3 0  Difficult doing work/chores Not difficult at all Not difficult at all  A/P: reasonable control- continue current medications   #chronic idiopathic urticaria- follow up with Dr. Lucie Leather on chronic immunotherapy   #Bee string allergy- history of anaphylaxis nad keeps epi pen on hand- we can refill as needed  #vitiligo- no treatments   #allergies- on xyzal, loratadine, Flonase  #posttraumatic hip fracture and total hip arthroplasty- took a year off running and did welll- back at it   #BRIGHT RED BLOOD PER RECTUM - noted tablespoon on Saturday and Sunday- no other symptoms. Wonders if could be hemorrhoid related- does have some itching.  -Dr. Leone Payor did prior colonoscopies- will reach out to consider update - holding off on CBC with minimal volume and asymptomatic   #cold sores- keeps valtrex on hand  Recommended follow up: Return in about 1 year (around 12/11/2024) for physical or sooner if needed.Schedule b4 you leave.  Lab/Order associations:NOT fasting   ICD-10-CM   1.  Preventative health care  Z00.00     2. BRBPR (bright red blood per rectum)  K62.5 Ambulatory referral to Gastroenterology     Meds ordered this encounter  Medications   EPINEPHrine (AUVI-Q) 0.3 mg/0.3 mL IJ SOAJ injection    Sig: Inject 0.3 mg into the muscle as needed for anaphylaxis, as directed for life-threatening allergic reactions    Dispense:  2 each    Refill:  2    Return precautions advised.  Tana Conch, MD

## 2023-12-12 NOTE — Patient Instructions (Addendum)
Send me a copy of labs please  [facility] contact- lets follow up with Dr. Leone Payor  Please call to schedule visit and/or procedure Address: [address] [place], [place], [place] [address] Phone: [phone]   Recommended follow up: Return in about 1 year (around 12/11/2024) for physical or sooner if needed.Schedule b4 you leave.

## 2023-12-12 NOTE — Telephone Encounter (Signed)
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

## 2023-12-15 ENCOUNTER — Other Ambulatory Visit (HOSPITAL_COMMUNITY): Payer: Self-pay

## 2023-12-18 ENCOUNTER — Encounter: Payer: Self-pay | Admitting: Internal Medicine

## 2023-12-18 ENCOUNTER — Other Ambulatory Visit (HOSPITAL_COMMUNITY): Payer: Self-pay

## 2023-12-18 ENCOUNTER — Ambulatory Visit: Payer: Commercial Managed Care - PPO | Admitting: Internal Medicine

## 2023-12-18 VITALS — BP 110/80 | HR 60 | Ht 69.0 in | Wt 162.4 lb

## 2023-12-18 DIAGNOSIS — K648 Other hemorrhoids: Secondary | ICD-10-CM

## 2023-12-18 MED ORDER — HYDROCORTISONE ACETATE 25 MG RE SUPP
25.0000 mg | Freq: Every evening | RECTAL | 1 refills | Status: DC | PRN
  Filled 2023-12-18: qty 12, 12d supply, fill #0

## 2023-12-18 NOTE — Patient Instructions (Signed)
_______________________________________________________  If your blood pressure at your visit was 140/90 or greater, please contact your primary care physician to follow up on this.  _______________________________________________________  If you are age 57 or older, your body mass index should be between 23-30. Your Body mass index is 23.98 kg/m. If this is out of the aforementioned range listed, please consider follow up with your Primary Care Provider.  If you are age 54 or younger, your body mass index should be between 19-25. Your Body mass index is 23.98 kg/m. If this is out of the aformentioned range listed, please consider follow up with your Primary Care Provider.   ________________________________________________________  The [facility] providers would like to encourage you to use [facility] to communicate with providers for non-urgent requests or questions.  Due to long hold times on the telephone, sending your provider a message by [facility] may be a faster and more efficient way to get a response.  Please allow 48 business hours for a response.  Please remember that this is for non-urgent requests.  _______________________________________________________  We have sent the following medications to your pharmacy for you to pick up at your convenience: Hydrocortisone suppositories  If symptoms worsen contact us.  I appreciate the opportunity to care for you. Stan Head, MD, [facility]

## 2023-12-18 NOTE — Progress Notes (Signed)
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   Bleeding internal hemorrhoids Yes    Meds ordered this encounter  Medications   hydrocortisone (ANUSOL-HC) 25 MG suppository    Sig: Place 1 suppository (25 mg total) rectally at bedtime as needed for hemorrhoids or anal itching.    Dispense:  12 suppository    Refill:  1    He can also try a fiber supplement of form of psyllium or Benefiber to improve defecation with the straining he has at times.  If he has worsening problems or persistent issues we will consider a colonoscopy but I do not think that is necessary at this time and he agrees.  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  He is an avid cyclist.  He can note some itching and perhaps some sensation of discomfort or pressure intermittently and will see some blood on the toilet paper and in the commode at times.  More recently about a week ago he saw more bleeding than typical with a dime sized amount of blood in/on the stool and blood on the toilet paper and then some rectal bleeding the next day as well. He has noted that he has to strain to stool at times.  There is no change in bowel caliber, and he moves his bowels once or twice a day with regularity is just initiating defecation can be troublesome at times.  He is going to try to drink more water he tells me.  GI review of systems is otherwise negative. Allergies  Allergen Reactions   Ibuprofen Hives   Current Meds  Medication Sig   fluticasone (FLONASE) 50 MCG/ACT nasal spray Place 1 spray into both nostrils daily.       levocetirizine (XYZAL) 5 MG tablet Take 1 tablet (5 mg total) by mouth 2 (two) times daily as needed for allergies (Can take an extra dose during flare ups.).   loratadine  (CLARITIN) 10 MG tablet Take 10 mg by mouth daily.   Omega-3 Fatty Acids (FISH OIL) 1000 MG CAPS Take 1,000 mg by mouth daily.   sertraline (ZOLOFT) 25 MG tablet Take 1 tablet (25 mg total) by mouth daily.   temazepam (RESTORIL) 15 MG capsule Take 1-2 capsules (15-30 mg total) by mouth at bedtime as needed.   valACYclovir (VALTREX) 1000 MG tablet Take 2 tablets (2,000 mg total) by mouth 2 (two) times daily for 1 day at first sign of cold sore/fever blister.   Past Medical History:  Diagnosis Date   Allergy    Anxiety    sertraline through PCP- since 1999   Depression    History of anal fissures    with cycling   HTN (hypertension)    Past Surgical History:  Procedure Laterality Date   MOUTH SURGERY     gum grafting x3   TOTAL HIP ARTHROPLASTY Left 10/27/2021   Procedure: TOTAL HIP ARTHROPLASTY ANTERIOR APPROACH;  Surgeon: Kathryne Hitch, MD;  Location: [facility];  Service: Orthopedics;  Laterality: Left;   VASECTOMY     WISDOM TOOTH EXTRACTION     Social History   Social History Narrative   Married. 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   Review of  Systems As per HPI otherwise negative  Objective:   Physical Exam BP 110/80 (BP Location: Left Arm, Patient Position: Sitting, Cuff Size: Normal)   Pulse 60   Ht 5\' 9"  (1.753 m)   Wt 162 lb 6 oz (73.7 kg)   BMI 23.98 kg/m  Middle-aged white man no acute distress  Rectal exam shows a normal anoderm.  Digital exam no mass palpable hemorrhoids in the left lateral and right posterior position with some anal stenosis/spasm but nontender.  Anoscopy shows violaceous prominent internal hemorrhoid columns in the left lateral and right posterior positions.

## 2023-12-25 ENCOUNTER — Encounter: Payer: Self-pay | Admitting: Family Medicine

## 2023-12-25 ENCOUNTER — Other Ambulatory Visit (HOSPITAL_COMMUNITY): Payer: Self-pay

## 2023-12-25 MED ORDER — JELCAINE STERILE 2 % EX GEL
1.0000 | CUTANEOUS | 2 refills | Status: AC | PRN
  Filled 2023-12-25: qty 30, 10d supply, fill #0

## 2023-12-26 ENCOUNTER — Other Ambulatory Visit: Payer: Self-pay

## 2023-12-26 ENCOUNTER — Other Ambulatory Visit (HOSPITAL_COMMUNITY): Payer: Self-pay

## 2023-12-29 ENCOUNTER — Other Ambulatory Visit (HOSPITAL_COMMUNITY): Payer: Self-pay

## 2023-12-30 ENCOUNTER — Ambulatory Visit (INDEPENDENT_AMBULATORY_CARE_PROVIDER_SITE_OTHER): Payer: Commercial Managed Care - PPO | Admitting: *Deleted

## 2023-12-30 DIAGNOSIS — J309 Allergic rhinitis, unspecified: Secondary | ICD-10-CM

## 2024-01-27 ENCOUNTER — Ambulatory Visit (INDEPENDENT_AMBULATORY_CARE_PROVIDER_SITE_OTHER): Payer: Self-pay

## 2024-01-27 DIAGNOSIS — J309 Allergic rhinitis, unspecified: Secondary | ICD-10-CM

## 2024-02-13 ENCOUNTER — Other Ambulatory Visit (HOSPITAL_COMMUNITY): Payer: Self-pay

## 2024-02-13 ENCOUNTER — Other Ambulatory Visit: Payer: Self-pay | Admitting: Family Medicine

## 2024-02-13 ENCOUNTER — Encounter: Payer: Self-pay | Admitting: Family Medicine

## 2024-02-13 ENCOUNTER — Encounter (HOSPITAL_COMMUNITY): Payer: Self-pay

## 2024-02-16 ENCOUNTER — Other Ambulatory Visit (HOSPITAL_COMMUNITY): Payer: Self-pay

## 2024-02-16 MED ORDER — CELECOXIB 200 MG PO CAPS
200.0000 mg | ORAL_CAPSULE | Freq: Every day | ORAL | 0 refills | Status: AC | PRN
  Filled 2024-02-16: qty 30, 30d supply, fill #0

## 2024-03-01 ENCOUNTER — Ambulatory Visit (INDEPENDENT_AMBULATORY_CARE_PROVIDER_SITE_OTHER): Payer: Self-pay

## 2024-03-01 DIAGNOSIS — J309 Allergic rhinitis, unspecified: Secondary | ICD-10-CM

## 2024-03-01 DIAGNOSIS — J3089 Other allergic rhinitis: Secondary | ICD-10-CM

## 2024-03-01 NOTE — Progress Notes (Signed)
 VIAL 1 MADE 03-01-24. EXP 03-01-25

## 2024-03-02 NOTE — Progress Notes (Signed)
 VIAL 2 MADE 03-02-24. EXP 03-02-25

## 2024-03-26 ENCOUNTER — Ambulatory Visit (INDEPENDENT_AMBULATORY_CARE_PROVIDER_SITE_OTHER): Admitting: *Deleted

## 2024-03-26 DIAGNOSIS — J309 Allergic rhinitis, unspecified: Secondary | ICD-10-CM

## 2024-03-31 LAB — LIPID PANEL
Cholesterol: 181 (ref 0–200)
HDL: 73 — AB (ref 35–70)
LDL Cholesterol: 100
Triglycerides: 39 — AB (ref 40–160)

## 2024-03-31 LAB — TSH: TSH: 2.69 (ref 0.41–5.90)

## 2024-03-31 LAB — CBC: RBC: 5.09 (ref 3.87–5.11)

## 2024-03-31 LAB — BASIC METABOLIC PANEL WITH GFR
BUN: 21 (ref 4–21)
CO2: 25 — AB (ref 13–22)
Chloride: 105 (ref 99–108)
Creatinine: 0.9 (ref 0.6–1.3)
Glucose: 96
Potassium: 4.5 meq/L (ref 3.5–5.1)
Sodium: 144 (ref 137–147)

## 2024-03-31 LAB — COMPREHENSIVE METABOLIC PANEL WITH GFR
Albumin: 4.8 (ref 3.5–5.0)
Calcium: 9.2 (ref 8.7–10.7)
Globulin: 2.3
eGFR: 101

## 2024-03-31 LAB — HEPATIC FUNCTION PANEL
ALT: 14 U/L (ref 10–40)
AST: 23 (ref 14–40)
Alkaline Phosphatase: 62 (ref 25–125)
Bilirubin, Total: 0.5

## 2024-03-31 LAB — CBC AND DIFFERENTIAL
HCT: 46 (ref 41–53)
Hemoglobin: 15.5 (ref 13.5–17.5)
Platelets: 163 10*3/uL (ref 150–400)
WBC: 4.3

## 2024-03-31 LAB — HEMOGLOBIN A1C: Hemoglobin A1C: 5.1

## 2024-03-31 LAB — PSA: PSA: 0.6

## 2024-04-06 ENCOUNTER — Ambulatory Visit: Admitting: Allergy and Immunology

## 2024-04-06 ENCOUNTER — Encounter: Payer: Self-pay | Admitting: Allergy and Immunology

## 2024-04-06 VITALS — BP 100/58 | HR 61 | Temp 97.9°F | Resp 16 | Ht 69.29 in | Wt 159.2 lb

## 2024-04-06 DIAGNOSIS — L500 Allergic urticaria: Secondary | ICD-10-CM | POA: Diagnosis not present

## 2024-04-06 DIAGNOSIS — J3089 Other allergic rhinitis: Secondary | ICD-10-CM | POA: Diagnosis not present

## 2024-04-06 NOTE — Patient Instructions (Signed)
?  1.  Continue immunotherapy ? ?2.  Continue Flonase and antihistamine if needed ? ?3.  Epi-Pen / Auvi-Q 0.3 if needed ? ?4. Return to clinic in 1 year or earlier if problem ?

## 2024-04-06 NOTE — Progress Notes (Unsigned)
 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  I last saw him in this clinic 07 May 2022.  He continues on immunotherapy every 4 weeks without adverse effect.  This therapy has resulted in very good control of his respiratory tract and cutaneous immune activation.  He has not required a systemic steroid or antibiotic for any type of airway issue or skin issue.  On occasion will use some Flonase and some antihistamine.  Allergies as of 04/06/2024       Reactions   Ibuprofen Hives        Medication List    celecoxib 200 MG capsule Commonly known as: CeleBREX Take 1 capsule (200 mg total) by mouth daily as needed for moderate pain (pain score 4-6).   EPINEPHrine 0.3 mg/0.3 mL Soaj injection Commonly known as: Auvi-Q Inject 0.3 mg into the muscle as needed for anaphylaxis, as directed for life-threatening allergic reactions   Fish Oil 1000 MG Caps Take 1,000 mg by mouth daily.   fluticasone 50 MCG/ACT nasal spray Commonly known as: FLONASE Place 1 spray into both nostrils daily.   hydrocortisone 25 MG suppository Commonly known as: ANUSOL-HC Place 1 suppository (25 mg total) rectally at bedtime as needed for hemorrhoids or anal itching.   Jelcaine Sterile 2 % jelly Generic drug: lidocaine Apply 1 Application topically as needed.   levocetirizine 5 MG tablet Commonly known as: XYZAL Take 1 tablet (5 mg total) by mouth 2 (two) times daily as needed for allergies (Can take an extra dose during flare ups.).   loratadine 10 MG tablet Commonly known as: CLARITIN Take 10  mg by mouth daily.   sertraline 25 MG tablet Commonly known as: ZOLOFT Take 1 tablet (25 mg total) by mouth daily.   temazepam 15 MG capsule Commonly known as: Restoril Take 1-2 capsules (15-30 mg total) by mouth at bedtime as needed.   valACYclovir 1000 MG tablet Commonly known as: VALTREX Take 2 tablets (2,000 mg total) by mouth 2 (two) times daily for 1 day at first sign of cold sore/fever blister.    Past Medical History:  Diagnosis Date   Allergy    Anxiety    sertraline through PCP- since 1999   Depression    History of anal fissures    with cycling   HTN (hypertension)     Past Surgical History:  Procedure Laterality Date   COLONOSCOPY  2017   MOUTH SURGERY     gum grafting x3   TOTAL HIP ARTHROPLASTY Left 10/27/2021   Procedure: TOTAL HIP ARTHROPLASTY ANTERIOR APPROACH;  Surgeon: Kathryne Hitch, MD;  Location: [facility];  Service: Orthopedics;  Laterality: Left;   VASECTOMY     WISDOM TOOTH EXTRACTION      Review of systems negative except as noted in HPI / PMHx or noted below:  Review of Systems  Constitutional: Negative.   HENT: Negative.    Eyes: Negative.   Respiratory: Negative.    Cardiovascular: Negative.   Gastrointestinal: Negative.   Genitourinary: Negative.   Musculoskeletal: Negative.   Skin: Negative.   Neurological: Negative.  Endo/Heme/Allergies: Negative.   Psychiatric/Behavioral: Negative.       Objective:   Vitals:   04/06/24 1555  BP: (!) 100/58  Pulse: 61  Resp: 16  Temp: 97.9 F (36.6 C)  SpO2: 99%   Height: 5' 9.29" (176 cm)  Weight: 159 lb 3.2 oz (72.2 kg)   Physical Exam Constitutional:      Appearance: He is not diaphoretic.  HENT:     Head: Normocephalic.     Right Ear: Tympanic membrane, ear canal and external ear normal.     Left Ear: Tympanic membrane, ear canal and external ear normal.     Nose: Nose normal. No mucosal edema or rhinorrhea.     Mouth/Throat:     Pharynx: Uvula midline. No  oropharyngeal exudate.  Eyes:     Conjunctiva/sclera: Conjunctivae normal.  Neck:     Thyroid: No thyromegaly.     Trachea: Trachea normal. No tracheal tenderness or tracheal deviation.  Cardiovascular:     Rate and Rhythm: Normal rate and regular rhythm.     Heart sounds: Normal heart sounds, S1 normal and S2 normal. No murmur heard. Pulmonary:     Effort: No respiratory distress.     Breath sounds: Normal breath sounds. No stridor. No wheezing or rales.  Lymphadenopathy:     Head:     Right side of head: No tonsillar adenopathy.     Left side of head: No tonsillar adenopathy.     Cervical: No cervical adenopathy.  Skin:    Findings: No erythema or rash.     Nails: There is no clubbing.  Neurological:     Mental Status: He is alert.     Diagnostics: none  Assessment and Plan:   1. Perennial allergic rhinitis   2. Allergic urticaria     1.  Continue immunotherapy  2.  Continue Flonase and antihistamine if needed  3.  Epi-Pen / Auvi-Q 0.3 if needed  4. 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  Schuyler Custard, MD Allergy / Immunology [facility]

## 2024-04-07 ENCOUNTER — Encounter: Payer: Self-pay | Admitting: Allergy and Immunology

## 2024-04-28 ENCOUNTER — Ambulatory Visit (INDEPENDENT_AMBULATORY_CARE_PROVIDER_SITE_OTHER): Payer: Self-pay

## 2024-04-28 DIAGNOSIS — J309 Allergic rhinitis, unspecified: Secondary | ICD-10-CM | POA: Diagnosis not present

## 2024-05-07 DIAGNOSIS — H524 Presbyopia: Secondary | ICD-10-CM | POA: Diagnosis not present

## 2024-06-07 ENCOUNTER — Other Ambulatory Visit (HOSPITAL_COMMUNITY): Payer: Self-pay

## 2024-06-08 ENCOUNTER — Other Ambulatory Visit (HOSPITAL_COMMUNITY): Payer: Self-pay

## 2024-06-08 ENCOUNTER — Ambulatory Visit (INDEPENDENT_AMBULATORY_CARE_PROVIDER_SITE_OTHER)

## 2024-06-08 DIAGNOSIS — J309 Allergic rhinitis, unspecified: Secondary | ICD-10-CM | POA: Diagnosis not present

## 2024-06-14 ENCOUNTER — Other Ambulatory Visit (HOSPITAL_COMMUNITY): Payer: Self-pay

## 2024-06-14 ENCOUNTER — Encounter: Payer: Self-pay | Admitting: Family Medicine

## 2024-06-14 MED ORDER — CEPHALEXIN 500 MG PO CAPS
500.0000 mg | ORAL_CAPSULE | Freq: Four times a day (QID) | ORAL | 0 refills | Status: AC
  Filled 2024-06-14: qty 28, 7d supply, fill #0

## 2024-06-15 ENCOUNTER — Other Ambulatory Visit (HOSPITAL_COMMUNITY): Payer: Self-pay

## 2024-06-18 ENCOUNTER — Other Ambulatory Visit: Payer: Self-pay | Admitting: Family Medicine

## 2024-06-18 ENCOUNTER — Other Ambulatory Visit (HOSPITAL_COMMUNITY): Payer: Self-pay

## 2024-06-18 MED ORDER — SERTRALINE HCL 25 MG PO TABS
25.0000 mg | ORAL_TABLET | Freq: Every day | ORAL | 3 refills | Status: AC
  Filled 2024-06-18: qty 90, 90d supply, fill #0
  Filled 2024-09-14: qty 90, 90d supply, fill #1
  Filled 2024-12-19: qty 90, 90d supply, fill #2

## 2024-06-22 ENCOUNTER — Ambulatory Visit (INDEPENDENT_AMBULATORY_CARE_PROVIDER_SITE_OTHER)

## 2024-06-22 DIAGNOSIS — J309 Allergic rhinitis, unspecified: Secondary | ICD-10-CM | POA: Diagnosis not present

## 2024-08-02 ENCOUNTER — Ambulatory Visit (INDEPENDENT_AMBULATORY_CARE_PROVIDER_SITE_OTHER)

## 2024-08-02 DIAGNOSIS — J309 Allergic rhinitis, unspecified: Secondary | ICD-10-CM | POA: Diagnosis not present

## 2024-08-27 ENCOUNTER — Ambulatory Visit (INDEPENDENT_AMBULATORY_CARE_PROVIDER_SITE_OTHER)

## 2024-08-27 DIAGNOSIS — J309 Allergic rhinitis, unspecified: Secondary | ICD-10-CM

## 2024-09-15 ENCOUNTER — Other Ambulatory Visit (HOSPITAL_COMMUNITY): Payer: Self-pay

## 2024-09-28 ENCOUNTER — Ambulatory Visit (INDEPENDENT_AMBULATORY_CARE_PROVIDER_SITE_OTHER)

## 2024-09-28 DIAGNOSIS — J309 Allergic rhinitis, unspecified: Secondary | ICD-10-CM

## 2024-10-27 ENCOUNTER — Other Ambulatory Visit (HOSPITAL_COMMUNITY): Payer: Self-pay

## 2024-10-28 ENCOUNTER — Ambulatory Visit (INDEPENDENT_AMBULATORY_CARE_PROVIDER_SITE_OTHER)

## 2024-10-28 DIAGNOSIS — J309 Allergic rhinitis, unspecified: Secondary | ICD-10-CM

## 2024-11-23 ENCOUNTER — Ambulatory Visit

## 2024-11-23 DIAGNOSIS — J309 Allergic rhinitis, unspecified: Secondary | ICD-10-CM

## 2024-11-30 DIAGNOSIS — F4323 Adjustment disorder with mixed anxiety and depressed mood: Secondary | ICD-10-CM | POA: Diagnosis not present

## 2024-12-02 DIAGNOSIS — J3089 Other allergic rhinitis: Secondary | ICD-10-CM | POA: Diagnosis not present

## 2024-12-02 DIAGNOSIS — J301 Allergic rhinitis due to pollen: Secondary | ICD-10-CM | POA: Diagnosis not present

## 2024-12-02 DIAGNOSIS — J3081 Allergic rhinitis due to animal (cat) (dog) hair and dander: Secondary | ICD-10-CM | POA: Diagnosis not present

## 2024-12-02 NOTE — Progress Notes (Signed)
 VIALS MADE ON 12/02/24

## 2024-12-06 ENCOUNTER — Other Ambulatory Visit (HOSPITAL_COMMUNITY): Payer: Self-pay

## 2024-12-06 MED ORDER — COVID-19 MRNA VAC-TRIS(PFIZER) 30 MCG/0.3ML IM SUSY
0.3000 mL | PREFILLED_SYRINGE | Freq: Once | INTRAMUSCULAR | 0 refills | Status: AC
  Filled 2024-12-06: qty 0.3, 1d supply, fill #0

## 2024-12-07 DIAGNOSIS — F4323 Adjustment disorder with mixed anxiety and depressed mood: Secondary | ICD-10-CM | POA: Diagnosis not present

## 2024-12-13 DIAGNOSIS — F4323 Adjustment disorder with mixed anxiety and depressed mood: Secondary | ICD-10-CM | POA: Diagnosis not present

## 2024-12-14 ENCOUNTER — Encounter: Payer: Self-pay | Admitting: Family Medicine

## 2024-12-14 ENCOUNTER — Encounter: Payer: Commercial Managed Care - PPO | Admitting: Family Medicine

## 2024-12-14 VITALS — BP 110/68 | HR 73 | Temp 97.8°F | Ht 69.29 in | Wt 159.0 lb

## 2024-12-14 DIAGNOSIS — Z23 Encounter for immunization: Secondary | ICD-10-CM

## 2024-12-14 DIAGNOSIS — Z0000 Encounter for general adult medical examination without abnormal findings: Secondary | ICD-10-CM

## 2024-12-14 NOTE — Progress Notes (Signed)
 " Phone: [phone]   Subjective:  Patient presents today for their annual physical. Chief complaint-noted.   See problem oriented charting- ROS- full  review of systems was completed and negative  except for topics noted under acute/chronic concerns  The following were reviewed and entered/updated in epic: Past Medical History:  Diagnosis Date   Allergy    Anxiety    sertraline  through PCP- since 1999   Depression    History of anal fissures    with cycling   HTN (hypertension)    Patient Active Problem List   Diagnosis Date Noted   Bee sting allergy 05/01/2022    Priority: Medium    Vitiligo 05/01/2022    Priority: Medium    Depression 10/26/2021    Priority: Medium    Chronic idiopathic urticaria 08/26/2015    Priority: Medium    Allergic rhinoconjunctivitis 08/26/2015    Priority: Medium    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    Priority: Low   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    Priority: Low   Low back pain 07/16/2017    Priority: Low   Gastrocnemius muscle tear 07/18/2015    Priority: Low   Biceps strain 04/22/2023   Past Surgical History:  Procedure Laterality Date   COLONOSCOPY  2017   MOUTH SURGERY     gum grafting x3   TOTAL HIP ARTHROPLASTY Left 10/27/2021   Procedure: TOTAL HIP ARTHROPLASTY ANTERIOR APPROACH;  Surgeon: Vernetta Lonni GRADE, MD;  Location: [facility];  Service: Orthopedics;  Laterality: Left;   VASECTOMY     WISDOM TOOTH EXTRACTION      Family History  Problem Relation Age of Onset   Alzheimer's disease Mother        27 in 2023   Hypertension Mother    Anemia Mother    Heart disease Father        mid 35s   Prostate cancer Father        in 62s   Hypertension Father    Vasculitis Father        in rehab got covid and died at 33   Healthy Sister    Healthy Sister    Alzheimer's disease Maternal Grandmother    Alzheimer's disease Maternal Grandfather    Parkinson's disease Paternal Grandmother    Lung cancer  Paternal Grandfather        lifelong smoker   Colon cancer Neg Hx    Esophageal cancer Neg Hx    Rectal cancer Neg Hx    Stomach cancer Neg Hx     Medications- reviewed and updated Current Outpatient Medications  Medication Sig Dispense Refill   fluticasone  (FLONASE ) 50 MCG/ACT nasal spray Place 1 spray into both nostrils daily. 16 g 11   loratadine  (CLARITIN ) 10 MG tablet Take 10 mg by mouth daily.     Omega-3 Fatty Acids (FISH OIL) 1000 MG CAPS Take 1,000 mg by mouth daily.     sertraline  (ZOLOFT ) 25 MG tablet Take 1 tablet (25 mg total) by mouth daily. 90 tablet 3   valACYclovir  (VALTREX ) 1000 MG tablet Take 2 tablets (2,000 mg total) by mouth 2 (two) times daily for 1 day at first sign of cold sore/fever blister. 30 tablet 2   celecoxib  (CELEBREX ) 200 MG capsule Take 1 capsule (200 mg total) by mouth daily as needed for moderate pain (pain score 4-6). (Patient not taking: Reported on 12/14/2024) 30 capsule 0   EPINEPHrine  (AUVI-Q ) 0.3 mg/0.3 mL IJ SOAJ injection Inject 0.3  mg into the muscle as needed for anaphylaxis, as directed for life-threatening allergic reactions (Patient not taking: Reported on 12/14/2024) 2 each 2   hydrocortisone  (ANUSOL -HC) 25 MG suppository Place 1 suppository (25 mg total) rectally at bedtime as needed for hemorrhoids or anal itching. (Patient not taking: Reported on 12/14/2024) 12 suppository 1   levocetirizine (XYZAL ) 5 MG tablet Take 1 tablet (5 mg total) by mouth 2 (two) times daily as needed for allergies (Can take an extra dose during flare ups.). (Patient not taking: Reported on 12/14/2024) 180 tablet 4   lidocaine  (JELCAINE STERILE) 2 % jelly Apply 1 Application topically as needed. (Patient not taking: Reported on 12/14/2024) 100 mL 2   temazepam  (RESTORIL ) 15 MG capsule Take 1-2 capsules (15-30 mg total) by mouth at bedtime as needed. (Patient not taking: Reported on 12/14/2024) 30 capsule 2   No current facility-administered medications for this  visit.    Allergies-reviewed and updated Allergies[1]  Social History   Social History Narrative   Married. 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 Oropharynx normal Neck: no thyromegaly CV: RRR no murmurs rubs or gallops Lungs: CTAB no crackles, wheeze, rhonchi Abdomen: soft/nontender/nondistended/normal bowel sounds. No rebound or guarding.  Ext: no edema Skin: warm, dry Neuro: grossly normal, moves all extremities, PERRLA    Assessment and Plan  58 y.o. male presenting for annual physical.  Health Maintenance counseling: 1. Anticipatory guidance: Patient counseled regarding regular dental exams -q6 months, eye exams -yearly,  avoiding smoking and second hand smoke , limiting alcohol to 2 beverages per day - 4 a week, no illicit drugs .   2. Risk factor reduction:  Advised patient of need for regular exercise and diet rich and fruits and vegetables to reduce risk of heart attack and stroke.  Exercise-, biking mainly lately- less running this year.  Diet/weight management-has maintained very healthy weight.  Wt Readings from Last 3 Encounters:  12/14/24 159 lb (72.1 kg)  04/06/24 159 lb 3.2 oz (72.2 kg)  12/18/23 162 lb 6 oz (73.7 kg)  3.  Immunizations/screenings/ancillary studies-fully up-to-date other than option of Prevnar 20-opts in . 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 Immunization History  Administered Date(s) Administered   Fluzone Influenza virus vaccine,trivalent (IIV3), split virus 09/22/2010, 09/08/2014, 09/23/2015, 09/19/2017, 09/23/2019, 09/21/2024   Hepatitis A, Adult 06/03/2007   Hepatitis B, ADULT 12/23/1992   Influenza-Unspecified 09/09/2023   Measles 05/26/1976   Moderna Sars-Covid-2 Vaccination 09/22/2019, 10/13/2019   Mumps 01/16/1971   Pfizer(Comirnaty )Fall Seasonal Vaccine 12 years and  older 12/06/2024   Pneumococcal Polysaccharide-23 07/15/2011   Rubella 01/18/1970   Tdap 09/27/2013, 08/15/2023   Typhoid Live 07/12/2011, 08/30/2016   Varicella 06/28/2003   Yellow Fever 08/30/2016   Zoster Recombinant(Shingrix) 11/11/2018, 01/29/2019   Zoster, Live 11/11/2018, 01/29/2019   4. Prostate cancer screening-  low risk prior trend- update PSA with doctor day labs Lab Results  Component Value Date   PSA 0.6 03/31/2024   PSA 0.6 03/26/2022   PSA 0.4 09/14/2019   5. Colon cancer screening - 10/22/16 with 10 year repeat. Some blood last year due to hemorrhoids- sat down with Dr. Avram but has been better so holding off on banding 6. Skin cancer screening- [place] [facility] Dr.. Whitworth. advised regular sunscreen use. Denies worrisome, changing, or new skin lesions. Very good about sunscreen with vitilgo 7. Smoking associated screening (lung cancer screening, AAA screen 65-75, UA)- never smoker 8. STD screening -  only active with wife  Status of chronic or acute concerns   # Depression S: Medication: sertraline  25 mg daily -therapist retired- will be getting plugged in with new one next week A/P: full remission- continue current medications   # Chronic idiopathic urticaria-on chronic long-term  immunotherapy with allergist   # Bee sting allergy-history of anaphylaxis and keeps EpiPen  on hand-refill if needed for office- mainly with camping. Has had some yellow jacket stings only minor reactions   # Allergies-remains on xyzal , Flonase    # History of posttraumatic fracture and total hip arthroplasty-doing well 2024 and beyond . Was with Dr. Vernetta. Skin tight after exercies.   # Cold sores-keep Valtrex  on hand- does not need refill   # Low back and muscular pain near iliac crest-sparing Celebrex  helpful in the past.  Has not needed this lately. Pilates has bene helpful and massage every 6 weeks. Angioedema on Motrin/Advil in the past but has tolerated Celebrex    # Bright red blood per rectum-likely hemorrhoid related- History-no recurrence and up-to-date on colonoscopy. No hydrocortisone  suppository needed   Recommended follow up: No follow-ups on file. Future Appointments  Date Time Provider Department Center  12/21/2024 11:00 AM Marijean Charleston, PhD [facility] None  04/12/2025  4:00 PM Kozlow, Camellia PARAS, MD [facility] None    Lab/Order associations:NOT fasting   ICD-10-CM   1. Preventative health care  Z00.00       No orders of the defined types were placed in this encounter.   Return precautions advised.  Garnette Lukes, MD      [1]  Allergies Allergen Reactions   Ibuprofen Hives   "

## 2024-12-14 NOTE — Patient Instructions (Addendum)
 Prevnar 20 today  Thanks for always dropping off bloodwork  Recommended follow up: Return in about 1 year (around 12/14/2025) for physical or sooner if needed.Schedule b4 you leave.

## 2024-12-14 NOTE — Addendum Note (Signed)
 Addended by: Macrina Lehnert on: 12/14/2024 11:44 AM   Modules accepted: Orders

## 2024-12-21 ENCOUNTER — Ambulatory Visit (INDEPENDENT_AMBULATORY_CARE_PROVIDER_SITE_OTHER): Admitting: Psychiatry

## 2024-12-21 ENCOUNTER — Ambulatory Visit (INDEPENDENT_AMBULATORY_CARE_PROVIDER_SITE_OTHER)

## 2024-12-21 DIAGNOSIS — Z630 Problems in relationship with spouse or partner: Secondary | ICD-10-CM | POA: Diagnosis not present

## 2024-12-21 DIAGNOSIS — F411 Generalized anxiety disorder: Secondary | ICD-10-CM | POA: Diagnosis not present

## 2024-12-21 DIAGNOSIS — J309 Allergic rhinitis, unspecified: Secondary | ICD-10-CM | POA: Diagnosis not present

## 2024-12-21 NOTE — Progress Notes (Unsigned)
 PROBLEM-FOCUSED INITIAL PSYCHOTHERAPY EVALUATION Jodie Kendall, PhD LP [facility]  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  Hx of phobias, responded to low dose SSRI (Zoloft  25mg  maintenance for years).  Moved here 2001, failed an exam by 1 question, started with [facility], until around 18 mos ago.  Main concern would be taming anxiety, which he has come to see as primary and historically a driver toward depression but not necessarily clinically depressive.  Current work stresses have been in managing infectious disease group, dealt with a colleague he felt was undermining him.  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  [employer] valued but went away with failed merger of [employer].  Has had interest in physician wellness.  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  Currently, still working some ID on weekends, since   At home, has had some ego-dystonic outbursts with wife.  Has been in marriage counseling with Heron Dub, PhD, achieving some better understanding of each other, accommodating to 2nd daughter going to college, and solving the snoring problem mostly she has.  About 2 weeks now worked out the noise problem using sound machine.  Also working out expectations for how they each fill the time without active parenting.  W has been aging hard the last couple years, not able  any more to do outdoor sports with him, like biking and kayaking.  Much harder to plan quality time together, and some delicacy around her state of health.  Financially, has passed through anxiety anbout retirement savings and potential scarcity.  Has struggled some with disproprotiojnate anger with W Toddie.  Background stress of who makes decisions.  One incident 2 yrs ago where Toddie suggested she might take a job at [employer], [employer] group moving them when he's settled.  Prior history of moving for her career in [place], [place], here, currently employed with [employer] and [employer].  Re anxiety coping, has used exercise to cope, and Pilates.  Hx of intrusive thoughts, and used rubber-band thought-stopping  Used the Anxiety & Phobia Workbook with [employer], and the Anger Management Workbook ([place]).  Still ahs some intrusive thoughts of how badly things are going to go, e.g., seeing residents in the training program.  Habit of gratitude journaling at the beginnin of his work day.    --> quiet minute --> include proud of moments in his gratitude journal --> make free use of workbooks at home --> bookmark to talk about this summer when   Prior Psychiatric Assessment/Treatment:   Outpatient treatment: Long hx of making use of counseling.  Last saw psychologist Valdene Hedding, helpful but fading into retirement and lost momentum about a year and a half ago.  History of a year off from medical school for stress reaction. Psychiatric hospitalization: none stated Psychological assessment/testing: none stated   Abuse/neglect screening: Victim of abuse: Not assessed at this time / none suspected.  But alludes to history of  emotionally provocative relationships with M and S. Victim of neglect: Not assessed at this time / none suspected.   Perpetrator of abuse/neglect: Not assessed at this time / none suspected.   Witness / Exposure to Domestic Violence: Not assessed at this  time / none suspected.   Witness to Community Violence:  Not assessed at this time / none suspected.   Protective Services Involvement: No.   Report needed: No.    Substance abuse screening: Current substance abuse: Not assessed at this time / none suspected.   History of impactful substance use/abuse: Not assessed at this time / none suspected.     FAMILY/SOCIAL HISTORY Family of origin -- deferred Family of intention/current living situation -- W Toddie, education officer, environmental with training in [place].  Two adult daughters, youngest in college.  Marital strain as noted. Education -- Highest level of education MD, with background for Master's in [employer] Vocation -- Medicine, specialty in infectious disease, an admin/training Finances -- Employed, with retirement funding assured, acceptable pay reduction with change of job role to something more nourishing, less stressful Product/process Development Scientist -- Spiritual/religious identification deferred Enjoyable activities -- outdoor sports, biking, kayaking, hunt/fish Other situational factors affecting treatment and prognosis: Stressors from the following areas: {AM Stressors:23445} Barriers to service: ***  Notable cultural sensitivities: {Religious/Cultural:200019} Strengths: {Patient Coping Strengths:[phone]}   MED/SURG HISTORY Med/surg history was partially reviewed with Pt at this time.  Of note for psychotherapy at this time history of back pain alleviated with exercise, Pilates, and antiinflamatory.   Past Medical History:  Diagnosis Date   Allergy    Anxiety    sertraline  through PCP- since 1999   Depression    History of anal fissures    with cycling   HTN (hypertension)      Past Surgical History:  Procedure Laterality Date   COLONOSCOPY  2017   MOUTH SURGERY     gum grafting x3   TOTAL HIP ARTHROPLASTY Left 10/27/2021   Procedure: TOTAL HIP ARTHROPLASTY ANTERIOR APPROACH;  Surgeon: Vernetta Lonni GRADE, MD;  Location: [facility];   Service: Orthopedics;  Laterality: Left;   VASECTOMY     WISDOM TOOTH EXTRACTION      Allergies[1]  Medications (as listed in Epic): Current Outpatient Medications  Medication Sig Dispense Refill   celecoxib  (CELEBREX ) 200 MG capsule Take 1 capsule (200 mg total) by mouth daily as needed for moderate pain (pain score 4-6). (Patient not taking: Reported on 12/14/2024) 30 capsule 0   EPINEPHrine  (AUVI-Q ) 0.3 mg/0.3 mL IJ SOAJ injection Inject 0.3 mg into the muscle as needed for anaphylaxis, as directed for life-threatening allergic reactions (Patient not taking: Reported on 12/14/2024) 2 each 2   fluticasone  (FLONASE ) 50 MCG/ACT nasal spray Place 1 spray into both nostrils daily. 16 g 11   levocetirizine (XYZAL ) 5 MG tablet Take 1 tablet (5 mg total) by mouth 2 (two) times daily as needed for allergies (Can take an extra dose during flare ups.). (Patient not taking: Reported on 12/14/2024) 180 tablet 4   lidocaine  (JELCAINE STERILE) 2 % jelly Apply 1 Application topically as needed. (Patient not taking: Reported on 12/14/2024) 100 mL 2   loratadine  (CLARITIN ) 10 MG tablet Take 10 mg by mouth daily.     Omega-3 Fatty Acids (FISH OIL) 1000 MG CAPS Take 1,000 mg by mouth daily.     sertraline  (ZOLOFT ) 25 MG tablet Take 1 tablet (25 mg total) by mouth daily. 90 tablet 3   temazepam  (RESTORIL ) 15 MG capsule Take  1-2 capsules (15-30 mg total) by mouth at bedtime as needed. (Patient not taking: Reported on 12/14/2024) 30 capsule 2   valACYclovir  (VALTREX ) 1000 MG tablet Take 2 tablets (2,000 mg total) by mouth 2 (two) times daily for 1 day at first sign of cold sore/fever blister. 30 tablet 2   No current facility-administered medications for this visit.    MENTAL STATUS AND OBSERVATIONS Appearance:   {PSY:22683}     Behavior:  {PSY:21022743}  Motor:  {PSY:22302}  Speech/Language:   {PSY:22685}  Affect:  {PSY:22687}  Mood:  {PSY:31886}  Thought process:  {PSY:31888}  Thought content:     {PSY:[phone]}  Sensory/Perceptual disturbances:    {PSY:6133744393}  Orientation:  {AM:23301::Fully oriented}  Attention:  {PSY:23770::Good}  Concentration:  {PSY:23770::Good}  Memory:  {[phone]}  Fund of knowledge:   {PSY:23770::Good}  Insight:    {PSY:23770::Good}  Judgment:   {PSY:23770::Good}  Impulse Control:  {PSY:23770::Good}   Initial Risk Assessment: Danger to self: {Risk:22599::No} Self-injurious behavior: {Risk:22599::No} Danger to others: {Risk:22599::No} Physical aggression / violence: {Risk:22599::No} Duty to warn: {AMYesNo:22526::No} Access to firearms a concern: {AMYesNo:22526::No} Gang involvement or other high risk behavior: {AMYesNo:22526::No} Patient / guardian was educated about steps to take if suicide or homicide risk level increases between visits: {Yes-No-NA-wild:22598::yes} While future psychiatric events cannot be accurately predicted, the patient does not currently require acute inpatient psychiatric care and does not currently meet [place]  involuntary commitment criteria.   DIAGNOSIS:    ICD-10-CM   1. Generalized anxiety disorder  F41.1    with social anxiety component      INITIAL TREATMENT: Support/validation provided for distressing symptoms and confirmed rapport Ethical orientation and informed consent confirmed re: Privacy rights -- including but not limited to HIPAA, EMR and use of e-PHI Working alliance -- expectations for working relationship in psychotherapy, initial orientation to cognitive-behavioral and solution-focused therapy approach Patient responsibilities -- scheduling, fair notice of changes, in-person vs. telehealth and regulatory and financial conditions affecting choice Coordination of care -- needs and consents for working partnerships and exchange of information with other health care providers, especially any medication and other behavioral health providers Outlook for therapy --  scheduling constraints, availability of crisis service, inclusion of family member(s) as appropriate Psychoeducation and initial recommendations: *** *** *** Initial goalsetting: *** *** ***  Plan: Initial homework to *** *** Maintain medication as prescribed and work faithfully with relevant prescriber(s) if any changes are desired or seem indicated Call the clinic on-call service, present to ER, or call 911 if any life-threatening psychiatric crisis No follow-ups on file.  Lamar Kendall, PhD  Jodie Kendall, PhD LP Clinical Psychologist, [facility] [address], [address] [place], [place] [address] ([phone]       [1]  Allergies Allergen Reactions   Ibuprofen Hives

## 2025-01-14 ENCOUNTER — Telehealth: Payer: Self-pay | Admitting: Family Medicine

## 2025-01-14 NOTE — Telephone Encounter (Signed)
 LVM to reschedule pt's 12/19/2025 Physical as the Provider will no longer be in office that day. Cancelled existing appt

## 2025-01-21 ENCOUNTER — Ambulatory Visit: Admitting: Psychiatry

## 2025-01-21 DIAGNOSIS — F411 Generalized anxiety disorder: Secondary | ICD-10-CM

## 2025-01-21 DIAGNOSIS — Z630 Problems in relationship with spouse or partner: Secondary | ICD-10-CM
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
## 2025-01-25 ENCOUNTER — Ambulatory Visit

## 2025-01-25 DIAGNOSIS — J302 Other seasonal allergic rhinitis: Secondary | ICD-10-CM | POA: Diagnosis not present

## 2025-02-03 ENCOUNTER — Ambulatory Visit: Admitting: Psychiatry

## 2025-02-15 ENCOUNTER — Ambulatory Visit: Admitting: Psychiatry

## 2025-02-16 ENCOUNTER — Ambulatory Visit: Admitting: Psychiatry

## 2025-03-23 ENCOUNTER — Ambulatory Visit: Admitting: Psychiatry

## 2025-04-06 ENCOUNTER — Ambulatory Visit: Admitting: Psychiatry

## 2025-04-12 ENCOUNTER — Ambulatory Visit: Admitting: Allergy and Immunology

## 2025-12-19 ENCOUNTER — Encounter: Admitting: Family Medicine
